# Patient Record
Sex: Female | Born: 1955
Health system: Southern US, Community
[De-identification: ages and names within clinical notes are randomized; demographics above are authoritative.]

## PROBLEM LIST (undated history)

## (undated) DIAGNOSIS — I10 Essential (primary) hypertension: Secondary | ICD-10-CM

## (undated) DIAGNOSIS — R945 Abnormal results of liver function studies: Secondary | ICD-10-CM

## (undated) DIAGNOSIS — K760 Fatty (change of) liver, not elsewhere classified: Secondary | ICD-10-CM

## (undated) DIAGNOSIS — E119 Type 2 diabetes mellitus without complications: Secondary | ICD-10-CM

## (undated) DIAGNOSIS — E7800 Pure hypercholesterolemia, unspecified: Secondary | ICD-10-CM

## (undated) DIAGNOSIS — G2581 Restless legs syndrome: Secondary | ICD-10-CM

## (undated) DIAGNOSIS — M1990 Unspecified osteoarthritis, unspecified site: Secondary | ICD-10-CM

## (undated) DIAGNOSIS — J189 Pneumonia, unspecified organism: Secondary | ICD-10-CM

## (undated) DIAGNOSIS — Z8489 Family history of other specified conditions: Secondary | ICD-10-CM

## (undated) HISTORY — PX: OTHER SURGICAL HISTORY: SHX169

## (undated) HISTORY — DX: Abnormal results of liver function studies: R94.5

## (undated) HISTORY — PX: SHOULDER SURGERY: SHX246

## (undated) HISTORY — DX: Unspecified osteoarthritis, unspecified site: M19.90

## (undated) HISTORY — DX: Essential (primary) hypertension: I10

## (undated) HISTORY — DX: Pure hypercholesterolemia, unspecified: E78.00

---

## 1998-02-10 ENCOUNTER — Inpatient Hospital Stay (HOSPITAL_COMMUNITY): Admission: AD | Admit: 1998-02-10 | Discharge: 1998-02-12 | Payer: Self-pay | Admitting: Obstetrics & Gynecology

## 1998-11-25 HISTORY — PX: CHOLECYSTECTOMY: SHX55

## 1998-11-29 ENCOUNTER — Emergency Department (HOSPITAL_COMMUNITY): Admission: EM | Admit: 1998-11-29 | Discharge: 1998-11-29 | Payer: Self-pay | Admitting: Emergency Medicine

## 1998-12-01 ENCOUNTER — Inpatient Hospital Stay (HOSPITAL_COMMUNITY): Admission: RE | Admit: 1998-12-01 | Discharge: 1998-12-03 | Payer: Self-pay | Admitting: *Deleted

## 1998-12-01 ENCOUNTER — Encounter: Payer: Self-pay | Admitting: *Deleted

## 1998-12-02 ENCOUNTER — Encounter: Payer: Self-pay | Admitting: *Deleted

## 2000-04-29 ENCOUNTER — Encounter: Admission: RE | Admit: 2000-04-29 | Discharge: 2000-04-29 | Payer: Self-pay | Admitting: Obstetrics & Gynecology

## 2000-04-29 ENCOUNTER — Encounter: Payer: Self-pay | Admitting: Obstetrics & Gynecology

## 2001-05-08 ENCOUNTER — Encounter: Admission: RE | Admit: 2001-05-08 | Discharge: 2001-05-08 | Payer: Self-pay | Admitting: Obstetrics & Gynecology

## 2001-05-08 ENCOUNTER — Encounter: Payer: Self-pay | Admitting: Obstetrics & Gynecology

## 2002-05-10 ENCOUNTER — Encounter: Payer: Self-pay | Admitting: Obstetrics & Gynecology

## 2002-05-10 ENCOUNTER — Encounter: Admission: RE | Admit: 2002-05-10 | Discharge: 2002-05-10 | Payer: Self-pay | Admitting: Obstetrics & Gynecology

## 2002-05-31 ENCOUNTER — Other Ambulatory Visit: Admission: RE | Admit: 2002-05-31 | Discharge: 2002-05-31 | Payer: Self-pay | Admitting: Obstetrics & Gynecology

## 2003-07-04 ENCOUNTER — Encounter: Payer: Self-pay | Admitting: Obstetrics & Gynecology

## 2003-07-04 ENCOUNTER — Encounter: Admission: RE | Admit: 2003-07-04 | Discharge: 2003-07-04 | Payer: Self-pay | Admitting: Obstetrics & Gynecology

## 2003-07-19 ENCOUNTER — Other Ambulatory Visit: Admission: RE | Admit: 2003-07-19 | Discharge: 2003-07-19 | Payer: Self-pay | Admitting: Obstetrics & Gynecology

## 2004-08-03 ENCOUNTER — Encounter: Admission: RE | Admit: 2004-08-03 | Discharge: 2004-08-03 | Payer: Self-pay | Admitting: Obstetrics & Gynecology

## 2004-08-13 ENCOUNTER — Other Ambulatory Visit: Admission: RE | Admit: 2004-08-13 | Discharge: 2004-08-13 | Payer: Self-pay | Admitting: Obstetrics & Gynecology

## 2004-10-29 ENCOUNTER — Ambulatory Visit: Payer: Self-pay | Admitting: Cardiology

## 2005-08-23 ENCOUNTER — Encounter: Admission: RE | Admit: 2005-08-23 | Discharge: 2005-08-23 | Payer: Self-pay | Admitting: Obstetrics & Gynecology

## 2005-09-20 ENCOUNTER — Other Ambulatory Visit: Admission: RE | Admit: 2005-09-20 | Discharge: 2005-09-20 | Payer: Self-pay | Admitting: Obstetrics & Gynecology

## 2006-03-18 ENCOUNTER — Encounter: Admission: RE | Admit: 2006-03-18 | Discharge: 2006-03-18 | Payer: Self-pay | Admitting: Family Medicine

## 2006-07-23 ENCOUNTER — Encounter: Admission: RE | Admit: 2006-07-23 | Discharge: 2006-07-23 | Payer: Self-pay | Admitting: Orthopedic Surgery

## 2006-08-11 ENCOUNTER — Encounter: Admission: RE | Admit: 2006-08-11 | Discharge: 2006-08-11 | Payer: Self-pay | Admitting: Orthopedic Surgery

## 2006-09-03 ENCOUNTER — Encounter: Admission: RE | Admit: 2006-09-03 | Discharge: 2006-09-03 | Payer: Self-pay | Admitting: Obstetrics & Gynecology

## 2007-09-06 ENCOUNTER — Encounter: Admission: RE | Admit: 2007-09-06 | Discharge: 2007-09-06 | Payer: Self-pay | Admitting: Orthopedic Surgery

## 2007-09-18 ENCOUNTER — Encounter: Admission: RE | Admit: 2007-09-18 | Discharge: 2007-09-18 | Payer: Self-pay | Admitting: Obstetrics & Gynecology

## 2008-09-21 ENCOUNTER — Encounter: Admission: RE | Admit: 2008-09-21 | Discharge: 2008-09-21 | Payer: Self-pay | Admitting: Obstetrics & Gynecology

## 2009-07-21 ENCOUNTER — Encounter: Admission: RE | Admit: 2009-07-21 | Discharge: 2009-07-21 | Payer: Self-pay | Admitting: Family Medicine

## 2009-09-22 ENCOUNTER — Encounter: Admission: RE | Admit: 2009-09-22 | Discharge: 2009-09-22 | Payer: Self-pay | Admitting: Obstetrics & Gynecology

## 2010-05-03 DIAGNOSIS — R030 Elevated blood-pressure reading, without diagnosis of hypertension: Secondary | ICD-10-CM | POA: Insufficient documentation

## 2010-05-03 DIAGNOSIS — I10 Essential (primary) hypertension: Secondary | ICD-10-CM | POA: Insufficient documentation

## 2010-05-03 DIAGNOSIS — Z8639 Personal history of other endocrine, nutritional and metabolic disease: Secondary | ICD-10-CM

## 2010-05-03 DIAGNOSIS — E7800 Pure hypercholesterolemia, unspecified: Secondary | ICD-10-CM | POA: Insufficient documentation

## 2010-05-03 DIAGNOSIS — Z862 Personal history of diseases of the blood and blood-forming organs and certain disorders involving the immune mechanism: Secondary | ICD-10-CM | POA: Insufficient documentation

## 2010-05-03 DIAGNOSIS — M159 Polyosteoarthritis, unspecified: Secondary | ICD-10-CM | POA: Insufficient documentation

## 2010-05-07 ENCOUNTER — Ambulatory Visit: Payer: Self-pay | Admitting: Pulmonary Disease

## 2010-06-07 ENCOUNTER — Ambulatory Visit (HOSPITAL_BASED_OUTPATIENT_CLINIC_OR_DEPARTMENT_OTHER): Admission: RE | Admit: 2010-06-07 | Discharge: 2010-06-07 | Payer: Self-pay | Admitting: Pulmonary Disease

## 2010-06-07 ENCOUNTER — Encounter: Payer: Self-pay | Admitting: Pulmonary Disease

## 2010-06-25 ENCOUNTER — Telehealth: Payer: Self-pay | Admitting: Pulmonary Disease

## 2010-06-26 ENCOUNTER — Ambulatory Visit: Payer: Self-pay | Admitting: Pulmonary Disease

## 2010-06-28 ENCOUNTER — Telehealth (INDEPENDENT_AMBULATORY_CARE_PROVIDER_SITE_OTHER): Payer: Self-pay | Admitting: *Deleted

## 2010-07-02 ENCOUNTER — Ambulatory Visit: Payer: Self-pay | Admitting: Pulmonary Disease

## 2010-07-02 DIAGNOSIS — G4761 Periodic limb movement disorder: Secondary | ICD-10-CM | POA: Insufficient documentation

## 2010-07-02 DIAGNOSIS — R0683 Snoring: Secondary | ICD-10-CM | POA: Insufficient documentation

## 2010-07-23 ENCOUNTER — Telehealth (INDEPENDENT_AMBULATORY_CARE_PROVIDER_SITE_OTHER): Payer: Self-pay | Admitting: *Deleted

## 2010-09-24 ENCOUNTER — Encounter: Admission: RE | Admit: 2010-09-24 | Discharge: 2010-09-24 | Payer: Self-pay | Admitting: Obstetrics & Gynecology

## 2010-12-16 ENCOUNTER — Encounter: Payer: Self-pay | Admitting: Obstetrics & Gynecology

## 2010-12-24 ENCOUNTER — Encounter
Admission: RE | Admit: 2010-12-24 | Discharge: 2010-12-24 | Payer: Self-pay | Source: Home / Self Care | Attending: Orthopedic Surgery | Admitting: Orthopedic Surgery

## 2010-12-25 NOTE — Assessment & Plan Note (Signed)
Summary: rov for review of sleep study   Copy to:  Maurice Small Primary Provider/Referring Provider:  Maurice Small  CC:  Pt is here for a f/u appt to discuss sleep study results. .  History of Present Illness: The pt comes in today for f/u of her recent sleep study.  She was found to have small numbers of obstructive events which do not meet the AHI criteria for osa diagnosis, but she did have moderate to loud snoring with frequent nonspecific arousals from sleep.  This is suggestive of UARS.  She was also found to have 120 plms, but very few resulted in arousal or awakening.  The pt's husband has complained about her kicking, but the pt is not aware that she does it.  She does occasionally have an unusual feeling in her legs, but nothing of signficance.  She does move her legs during the day at times to make them more comfortable.  Medications Prior to Update: 1)  Calcium Carbonate 1500 Mg Tabs (Calcium Carbonate) .Marland Kitchen.. 1 By Mouth Once Daily 2)  Vitamin D 1000 Unit Tabs (Cholecalciferol) .Marland Kitchen.. 1 By Mouth Once Daily 3)  Aspirin 81 Mg Tbec (Aspirin) .Marland Kitchen.. 1 By Mouth Once Daily 4)  Folic Acid 800 Mcg Tabs (Folic Acid) .... Take 1 Tablet By Mouth Once A Day 5)  Norvasc 5 Mg Tabs (Amlodipine Besylate) .Marland Kitchen.. 1 By Mouth Once Daily 6)  Multivitamins  Tabs (Multiple Vitamin) .... Take 1 Tablet By Mouth Once A Day  Allergies (verified): 1)  ! Demerol 2)  ! Ace Inhibitors  Review of Systems       The patient complains of non-productive cough, acid heartburn, indigestion, sore throat, and headaches.  The patient denies shortness of breath with activity, shortness of breath at rest, productive cough, coughing up blood, chest pain, irregular heartbeats, loss of appetite, weight change, abdominal pain, difficulty swallowing, tooth/dental problems, nasal congestion/difficulty breathing through nose, sneezing, itching, ear ache, anxiety, depression, hand/feet swelling, joint stiffness or pain, rash, change  in color of mucus, and fever.    Vital Signs:  Patient profile:   55 year old female Height:      62 inches Weight:      176.38 pounds BMI:     32.38 O2 Sat:      97 % on Room air Temp:     98.6 degrees F oral Pulse rate:   117 / minute BP sitting:   160 / 84  (right arm) Cuff size:   regular  Vitals Entered By: Arman Filter LPN (July 02, 2010 4:11 PM)  O2 Flow:  Room air CC: Pt is here for a f/u appt to discuss sleep study results.  Comments Medications reviewed with patient Arman Filter LPN  July 02, 2010 4:14 PM    Physical Exam  General:  ow female in nad Extremities:  no edema or cyanosis Neurologic:  alert and oriented, not sleepy, moves all 4.    Impression & Recommendations:  Problem # 1:  PERIODIC LIMB MOVEMENT DISORDER (ICD-333.99)  the pt had large numbers of plms during her sleep study.  It is unclear if this is contributing to her daytime symptoms, but I think we should give her a trial of a dopamine agonist to see if she improves.  She is willing to give this a try.    Problem # 2:  SNORING (ICD-786.09)  The pt did have loud snoring during the study, as well as many nonspecific arousals.  This  is suggestive of UARS, which is a pre-sleep apnea condition that can lead to sleep disruption.  I have recommended that she work on weight loss and positional therapy, and she can also consider upper airway surgery given her abnormal anatomy.  A dental appliance would work well for this, but I am hesitant to recommend due to her nasal obstruction.  Medications Added to Medication List This Visit: 1)  Requip 0.5 Mg Tabs (Ropinirole hcl) .... One each night after dinner  Other Orders: Est. Patient Level III (44010)  Patient Instructions: 1)  will try requip 0.5mg  one after dinner each night for next 3 weeks.  please call and give me feedback with your response. 2)  work on weight loss and staying off your back while sleeping 3)  if you want to consider ENT  evaluation, please let me know.   Prescriptions: REQUIP 0.5 MG  TABS (ROPINIROLE HCL) one each night after dinner  #30 x 6   Entered and Authorized by:   Barbaraann Share MD   Signed by:   Barbaraann Share MD on 07/02/2010   Method used:   Print then Give to Patient   RxID:   [id number]

## 2010-12-25 NOTE — Progress Notes (Signed)
Summary: sleep study results  Phone Note Call from Patient Call back at [phone]   Caller: Patient Call For: Tracie Wagner Summary of Call: calling for sleep study results Initial call taken by: Rickard Patience,  June 25, 2010 10:17 AM  Follow-up for Phone Call        per EMR, sleep study scheduled for 7.14.11 - called pt to verifiy.  sleep study not in EMR.  pt will be at work until 2 or 3, then may LM at home #.    [name], have you seen this? Boone Master CNA/MA  June 25, 2010 10:37 AM   No I have not seen this pt's sleep study results yet.  Will forward message to [facility] to see if sleep study has been read yet.  Aundra Millet Reynolds LPN  June 25, 2010 10:54 AM   Additional Follow-up for Phone Call Additional follow up Details #1::        will call when results available. Additional Follow-up by: Barbaraann Share MD,  June 25, 2010 5:09 PM

## 2010-12-25 NOTE — Assessment & Plan Note (Signed)
Summary: consult for possible osa   Copy to:  Maurice Small Primary Provider/Referring Provider:  Maurice Small  CC:  Sleep Consult.  History of Present Illness: The pt is a 55y/o female who I have been asked to see for possible osa.  She has been noted to have loud snoring, as well as pauses in her breathing during sleep.  She denies any choking arousals.  She goes to bed at 10pm and arises at 5-7am to start her day.  She is a restless sleeper, and feels tired in the am's upon arising.  She has a very active job, but does note sleep pressure while on her lunch breaks.  She does think her alertness and concentration while at work are not normal.  She does have issues at times with falling asleep while watching tv or movies, and notes some sleep pressure with driving longer distances.  Current Medications (verified): 1)  Calcium Carbonate 1500 Mg Tabs (Calcium Carbonate) .Marland Kitchen.. 1 By Mouth Once Daily 2)  Vitamin D 1000 Unit Tabs (Cholecalciferol) .Marland Kitchen.. 1 By Mouth Once Daily 3)  Aspirin 81 Mg Tbec (Aspirin) .Marland Kitchen.. 1 By Mouth Once Daily 4)  Folic Acid 800 Mcg Tabs (Folic Acid) .... Take 1 Tablet By Mouth Once A Day 5)  Norvasc 5 Mg Tabs (Amlodipine Besylate) .Marland Kitchen.. 1 By Mouth Once Daily 6)  Multivitamins  Tabs (Multiple Vitamin) .... Take 1 Tablet By Mouth Once A Day  Allergies (verified): 1)  ! Demerol 2)  ! Ace Inhibitors  Past History:  Past Medical History:  HYPERTENSION, BENIGN ESSENTIAL (ICD-401.1) OSTEOARTHRITIS, GENERALIZED, MULTIPLE JOINTS (ICD-715.09) PURE HYPERCHOLESTEROLEMIA (ICD-272.0) LIVER FUNCTION TESTS, ABNORMAL, HX OF (ICD-V12.2)    Past Surgical History: Shoulder surgery Cholecystectomy  Jan2000  Family History: Reviewed history and no changes required. heart disease: mother, maternal grandfather, sister  Social History: Reviewed history from 05/03/2010 and no changes required. Never smoked Rare ETOH Married pt has 2 children. pt works as an Hotel manager  Review of Systems       The patient complains of sore throat and headaches.  The patient denies shortness of breath with activity, shortness of breath at rest, productive cough, non-productive cough, coughing up blood, chest pain, irregular heartbeats, acid heartburn, indigestion, loss of appetite, weight change, abdominal pain, difficulty swallowing, tooth/dental problems, nasal congestion/difficulty breathing through nose, sneezing, itching, ear ache, anxiety, depression, hand/feet swelling, joint stiffness or pain, rash, change in color of mucus, and fever.    Vital Signs:  Patient profile:   55 year old female Height:      62 inches Weight:      175.31 pounds BMI:     32.18 O2 Sat:      95 % on Room air Temp:     98.3 degrees F oral Pulse rate:   78 / minute BP sitting:   138 / 86  (left arm) Cuff size:   regular  Vitals Entered By: Arman Filter LPN (May 07, 2010 1:39 PM)  O2 Flow:  Room air CC: Sleep Consult Comments Medications reviewed with patient Arman Filter LPN  May 07, 2010 1:45 PM    Physical Exam  General:  ow female in nad Eyes:  PERRLA and EOMI.   Nose:  deviated septum to left with significant narrowing Mouth:  moderate elongation of soft palate and uvula. Neck:  no jvd, tmg, LN Lungs:  clear to auscultation Heart:  rrr, no mrg Abdomen:  soft and nontender, bs+ Extremities:  no significant edema or cyanosis  pulses intact distally Neurologic:  alert and oriented, moves all 4.   Impression & Recommendations:  Problem # 1:  OBSTRUCTIVE SLEEP APNEA (ICD-327.23) the pt's history is very suggestive of osa.  Her husband has noted snoring and breathing issues during sleep, and she has very restless and nonrestorative sleep.  She stays very busy during the day, but clearly has EDS with periods of inactivity.  I have had a long discussion with the pt about sleep apnea, including its impact on QOL and CV health.  I think she needs a sleep study for  diagnosis, and will see her back when results are available.  The pt is agreeable.  Medications Added to Medication List This Visit: 1)  Folic Acid 800 Mcg Tabs (Folic acid) .... Take 1 tablet by mouth once a day 2)  Multivitamins Tabs (Multiple vitamin) .... Take 1 tablet by mouth once a day  Other Orders: Consultation Level IV (60454) Sleep Disorder Referral (Sleep Disorder)  Patient Instructions: 1)  will set up for sleep study, and arrange followup once results are available.

## 2010-12-25 NOTE — Progress Notes (Signed)
Summary: FYI - LMTCB x 1  Phone Note Call from Patient Call back at [facility] Phone [phone]   Caller: Patient Call For: clance Summary of Call: FYI: Wants [facility] to know that requip seems to be working. Initial call taken by: Darletta Moll,  July 23, 2010 3:29 PM  Follow-up for Phone Call        At last OV on 8.8.11, pt was told to try requip 0.5mg  one after dinner each night for next 3 weeks and call for update on how this is working.  Spoke with pt.  She states she is not as tired during the day and does not feel the need to take a nap at lunch.  States this seems to be working.  Will forward message to [facility] as FYI. Follow-up by: Gweneth Dimitri RN,  July 23, 2010 3:44 PM  Additional Follow-up for Phone Call Additional follow up Details #1::        good.  I would stay on this. keep working on weight loss and positional therapy to help your snoring. Additional Follow-up by: Barbaraann Share MD,  July 23, 2010 5:28 PM    Additional Follow-up for Phone Call Additional follow up Details #2::    Left message with pt's family memeber for her to call office back.  Gweneth Dimitri RN  July 23, 2010 5:30 PM  pt returned our call.  informed her of [facility]'s response/recs to continue on requip, work on weight loss and positional therapy to help with snoring.  pt verbalized understanding and denied any questions.  Aundra Millet Reynolds LPN  July 24, 2010 9:13 AM

## 2010-12-25 NOTE — Progress Notes (Signed)
Summary: need to sched ov with kc-LMTCBx  1  Phone Note Outgoing Call   Call placed by: Arman Filter LPN,  June 28, 2010 2:28 PM Call placed to: Patient Summary of Call: per [facility], pt needs ov with kc next week  to discuss sleep study results. Marland KitchenArman Filter LPN  June 28, 2010 2:28 PM   Follow-up for Phone Call        [facility] Vernie Murders  June 28, 2010 3:48 PM   pt returned our call.  pt scheduled to see [facility] 07-02-2010 at 4:15pm.  Arman Filter LPN  June 28, 2010 5:07 PM

## 2011-04-26 ENCOUNTER — Emergency Department (HOSPITAL_COMMUNITY)
Admission: EM | Admit: 2011-04-26 | Discharge: 2011-04-26 | Disposition: A | Discharge status: Home / Self Care | Payer: No Typology Code available for payment source | Attending: Emergency Medicine | Admitting: Emergency Medicine

## 2011-04-26 ENCOUNTER — Emergency Department (HOSPITAL_COMMUNITY): Payer: No Typology Code available for payment source

## 2011-04-26 DIAGNOSIS — Y92410 Unspecified street and highway as the place of occurrence of the external cause: Secondary | ICD-10-CM | POA: Insufficient documentation

## 2011-04-26 DIAGNOSIS — M47812 Spondylosis without myelopathy or radiculopathy, cervical region: Secondary | ICD-10-CM | POA: Insufficient documentation

## 2011-04-26 DIAGNOSIS — R079 Chest pain, unspecified: Secondary | ICD-10-CM | POA: Insufficient documentation

## 2011-04-26 DIAGNOSIS — M542 Cervicalgia: Secondary | ICD-10-CM | POA: Insufficient documentation

## 2011-04-26 DIAGNOSIS — M25519 Pain in unspecified shoulder: Secondary | ICD-10-CM | POA: Insufficient documentation

## 2011-04-26 DIAGNOSIS — I10 Essential (primary) hypertension: Secondary | ICD-10-CM | POA: Insufficient documentation

## 2011-04-26 DIAGNOSIS — T1490XA Injury, unspecified, initial encounter: Secondary | ICD-10-CM | POA: Insufficient documentation

## 2011-06-05 ENCOUNTER — Other Ambulatory Visit: Payer: Self-pay | Admitting: Pulmonary Disease

## 2011-09-02 ENCOUNTER — Other Ambulatory Visit: Payer: Self-pay | Admitting: Obstetrics & Gynecology

## 2011-09-02 DIAGNOSIS — Z1231 Encounter for screening mammogram for malignant neoplasm of breast: Secondary | ICD-10-CM

## 2011-09-30 ENCOUNTER — Ambulatory Visit
Admission: RE | Admit: 2011-09-30 | Discharge: 2011-09-30 | Disposition: A | Discharge status: Home / Self Care | Payer: PRIVATE HEALTH INSURANCE | Source: Ambulatory Visit | Attending: Obstetrics & Gynecology | Admitting: Obstetrics & Gynecology

## 2011-09-30 DIAGNOSIS — Z1231 Encounter for screening mammogram for malignant neoplasm of breast: Secondary | ICD-10-CM

## 2011-10-14 ENCOUNTER — Encounter: Payer: Self-pay | Admitting: Pulmonary Disease

## 2011-10-15 ENCOUNTER — Encounter: Payer: Self-pay | Admitting: Pulmonary Disease

## 2011-10-15 ENCOUNTER — Ambulatory Visit (INDEPENDENT_AMBULATORY_CARE_PROVIDER_SITE_OTHER): Payer: PRIVATE HEALTH INSURANCE | Admitting: Pulmonary Disease

## 2011-10-15 VITALS — BP 120/76 | HR 83 | Temp 98.1°F | Ht 62.0 in | Wt 166.8 lb

## 2011-10-15 DIAGNOSIS — G2589 Other specified extrapyramidal and movement disorders: Secondary | ICD-10-CM

## 2011-10-15 MED ORDER — ROPINIROLE HCL 0.5 MG PO TABS: 1.0000 mg | ORAL_TABLET | Freq: Every day | ORAL | Status: DC

## 2011-10-15 NOTE — Assessment & Plan Note (Signed)
The patient has seen definite improvement in her leg kicking and also sleep since being on the Requip.  She is still having some breakthrough noted, and I have told her to increase her dose after dinner to see if that will help.  If she continues to have issues would consider checking an iron panel.  If doing well, she will follow up with me in one year.

## 2011-10-15 NOTE — Progress Notes (Signed)
  Subjective:    Patient ID: Tracie Wagner, female    DOB: 1956/11/16, 55 y.o.   MRN: [id number]  HPI The patient comes in today for followup of her known periodic limb movement disorder.  She was started on Requip at the last visit, and has seen some improvement in her leg kicks and also her sleep.  She still has some breakthrough according to her husband.  She knows that she is continuing to snore, but she has lost 10 pounds since the last visit.  I have encouraged her to continue.  Also discussed with her again treatment for her snoring other than weight loss, which can include upper airway surgery and dental appliance.   Review of Systems  Constitutional: Negative for fever and unexpected weight change.  HENT: Positive for congestion. Negative for ear pain, nosebleeds, sore throat, rhinorrhea, sneezing, trouble swallowing, dental problem, postnasal drip and sinus pressure.   Eyes: Negative for redness and itching.  Respiratory: Negative for cough, chest tightness, shortness of breath and wheezing.   Cardiovascular: Negative for palpitations and leg swelling.  Gastrointestinal: Negative for nausea and vomiting.  Genitourinary: Negative for dysuria.  Musculoskeletal: Negative for joint swelling.  Skin: Negative for rash.  Neurological: Negative for headaches.  Hematological: Does not bruise/bleed easily.  Psychiatric/Behavioral: Negative for dysphoric mood. The patient is not nervous/anxious.        Objective:   Physical Exam Overweight female in no acute distress Nose without purulence or discharge noted Lower extremities without edema , no cyanosis Alert, does not appear to be sleepy, moves all 4 extremities.       Assessment & Plan:

## 2011-10-15 NOTE — Patient Instructions (Signed)
Try increasing requip to 2 of the 0.5mg  tabs after dinner.   Continue working on weight loss, you are doing great.  followup with me in one year, but call if still having breakthru kicks.
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
## 2012-10-13 ENCOUNTER — Other Ambulatory Visit: Payer: Self-pay | Admitting: Obstetrics & Gynecology

## 2012-10-13 DIAGNOSIS — Z1231 Encounter for screening mammogram for malignant neoplasm of breast: Secondary | ICD-10-CM

## 2012-10-27 ENCOUNTER — Telehealth: Payer: Self-pay | Admitting: Pulmonary Disease

## 2012-10-27 NOTE — Telephone Encounter (Deleted)
Last OV 10/15/11

## 2012-10-27 NOTE — Telephone Encounter (Addendum)
Last OV 10/15/11 told to f/u 1 year. Needs to scheduled ROV before refills can be sent. lmomtcb x1 for pt

## 2012-10-28 NOTE — Telephone Encounter (Signed)
LMTCB

## 2012-10-29 MED ORDER — ROPINIROLE HCL 0.5 MG PO TABS: 1.0000 mg | ORAL_TABLET | Freq: Every day | ORAL | Status: DC

## 2012-10-29 NOTE — Telephone Encounter (Signed)
LMOMTCB x 1 for the pt. 

## 2012-10-29 NOTE — Telephone Encounter (Signed)
Spoke to pt and she understands that she needs an appointment. She will be seen on 11/11/12 at 11:45am. I advised that we can give her enough medication until her appointment.

## 2012-10-29 NOTE — Telephone Encounter (Signed)
Returning call can be reached at [phone].Tracie Wagner

## 2012-11-11 ENCOUNTER — Encounter: Payer: Self-pay | Admitting: Pulmonary Disease

## 2012-11-11 ENCOUNTER — Ambulatory Visit (INDEPENDENT_AMBULATORY_CARE_PROVIDER_SITE_OTHER): Payer: PRIVATE HEALTH INSURANCE | Admitting: Pulmonary Disease

## 2012-11-11 VITALS — BP 128/80 | HR 83 | Temp 98.2°F | Ht 62.0 in | Wt 176.8 lb

## 2012-11-11 DIAGNOSIS — R0989 Other specified symptoms and signs involving the circulatory and respiratory systems: Secondary | ICD-10-CM

## 2012-11-11 DIAGNOSIS — G2589 Other specified extrapyramidal and movement disorders: Secondary | ICD-10-CM

## 2012-11-11 DIAGNOSIS — R0609 Other forms of dyspnea: Secondary | ICD-10-CM

## 2012-11-11 MED ORDER — ROPINIROLE HCL 0.5 MG PO TABS: 1.0000 mg | ORAL_TABLET | Freq: Every day | ORAL | Status: DC

## 2012-11-11 NOTE — Progress Notes (Signed)
  Subjective:    Patient ID: Tracie Wagner, female    DOB: 19-Oct-1956, 56 y.o.   MRN: [id number]  HPI Patient comes in today for followup of her restless leg syndrome.  She is maintained on her dopamine agonist, and feels that she is doing very well with this.  She rarely has breakthrough, and feels that she is sleeping better.  Her weight has gone up since the last visit, and she has a little more snoring.   Review of Systems  Constitutional: Negative for fever and unexpected weight change.  HENT: Negative for ear pain, nosebleeds, congestion, sore throat, rhinorrhea, sneezing, trouble swallowing, dental problem, postnasal drip and sinus pressure.   Eyes: Negative for redness and itching.  Respiratory: Negative for cough, chest tightness, shortness of breath and wheezing.   Cardiovascular: Negative for palpitations and leg swelling.  Gastrointestinal: Negative for nausea and vomiting.  Genitourinary: Negative for dysuria.  Musculoskeletal: Negative for joint swelling.  Skin: Negative for rash.  Neurological: Negative for headaches.  Hematological: Does not bruise/bleed easily.  Psychiatric/Behavioral: Negative for dysphoric mood. The patient is not nervous/anxious.        Objective:   Physical Exam Overweight female in no acute distress Nose without purulent discharge noted  Neck without lymphadenopathy or thyromegaly Lower extremities with minimal edema, no cyanosis Alert and oriented, does not appear to be sleepy, moves all 4 extremities.       Assessment & Plan:

## 2012-11-11 NOTE — Patient Instructions (Addendum)
Will refill your requip. Work on weight loss and exercise program followup with me in one year if doing well.

## 2012-11-11 NOTE — Assessment & Plan Note (Signed)
She has gained 10 pounds since the last visit, and I have encouraged her to keep working on this.

## 2012-11-11 NOTE — Assessment & Plan Note (Signed)
The pt is sleeping well on her dopamine agonist, and feels that her limb movements are fairly well controlled.  I have asked her to stay on her current meds, and to work on exercise which is also a treatment for RLS.  I will see her back in a year, but if still doing well, will turn her back over to her primary who can write for her requip.

## 2012-11-23 ENCOUNTER — Ambulatory Visit
Admission: RE | Admit: 2012-11-23 | Discharge: 2012-11-23 | Disposition: A | Discharge status: Home / Self Care | Payer: PRIVATE HEALTH INSURANCE | Source: Ambulatory Visit | Attending: Obstetrics & Gynecology | Admitting: Obstetrics & Gynecology

## 2012-11-23 DIAGNOSIS — Z1231 Encounter for screening mammogram for malignant neoplasm of breast: Secondary | ICD-10-CM

## 2012-11-30 ENCOUNTER — Other Ambulatory Visit: Payer: Self-pay | Admitting: Obstetrics & Gynecology

## 2012-11-30 DIAGNOSIS — R928 Other abnormal and inconclusive findings on diagnostic imaging of breast: Secondary | ICD-10-CM

## 2012-12-07 ENCOUNTER — Ambulatory Visit
Admission: RE | Admit: 2012-12-07 | Discharge: 2012-12-07 | Disposition: A | Discharge status: Home / Self Care | Payer: PRIVATE HEALTH INSURANCE | Source: Ambulatory Visit | Attending: Obstetrics & Gynecology | Admitting: Obstetrics & Gynecology

## 2012-12-07 DIAGNOSIS — R928 Other abnormal and inconclusive findings on diagnostic imaging of breast: Secondary | ICD-10-CM

## 2013-01-15 ENCOUNTER — Telehealth: Payer: Self-pay | Admitting: Pulmonary Disease

## 2013-01-15 MED ORDER — ROPINIROLE HCL 0.5 MG PO TABS: 1.0000 mg | ORAL_TABLET | Freq: Every day | ORAL | Status: DC

## 2013-01-15 NOTE — Telephone Encounter (Signed)
Rx was sent to pharm  LMOM for the pt to be made aware 

## 2013-04-12 ENCOUNTER — Encounter (HOSPITAL_COMMUNITY): Payer: Self-pay | Admitting: Emergency Medicine

## 2013-04-12 ENCOUNTER — Emergency Department (HOSPITAL_COMMUNITY)
Admission: EM | Admit: 2013-04-12 | Discharge: 2013-04-12 | Disposition: A | Discharge status: Home / Self Care | Payer: PRIVATE HEALTH INSURANCE | Attending: Emergency Medicine | Admitting: Emergency Medicine

## 2013-04-12 DIAGNOSIS — Z888 Allergy status to other drugs, medicaments and biological substances status: Secondary | ICD-10-CM | POA: Insufficient documentation

## 2013-04-12 DIAGNOSIS — I10 Essential (primary) hypertension: Secondary | ICD-10-CM | POA: Insufficient documentation

## 2013-04-12 DIAGNOSIS — Y9389 Activity, other specified: Secondary | ICD-10-CM | POA: Insufficient documentation

## 2013-04-12 DIAGNOSIS — Z8719 Personal history of other diseases of the digestive system: Secondary | ICD-10-CM | POA: Insufficient documentation

## 2013-04-12 DIAGNOSIS — S61209A Unspecified open wound of unspecified finger without damage to nail, initial encounter: Secondary | ICD-10-CM | POA: Insufficient documentation

## 2013-04-12 DIAGNOSIS — E7800 Pure hypercholesterolemia, unspecified: Secondary | ICD-10-CM | POA: Insufficient documentation

## 2013-04-12 DIAGNOSIS — W260XXA Contact with knife, initial encounter: Secondary | ICD-10-CM | POA: Insufficient documentation

## 2013-04-12 DIAGNOSIS — Z23 Encounter for immunization: Secondary | ICD-10-CM | POA: Insufficient documentation

## 2013-04-12 DIAGNOSIS — Z79899 Other long term (current) drug therapy: Secondary | ICD-10-CM | POA: Insufficient documentation

## 2013-04-12 DIAGNOSIS — Z8739 Personal history of other diseases of the musculoskeletal system and connective tissue: Secondary | ICD-10-CM | POA: Insufficient documentation

## 2013-04-12 DIAGNOSIS — Y92009 Unspecified place in unspecified non-institutional (private) residence as the place of occurrence of the external cause: Secondary | ICD-10-CM | POA: Insufficient documentation

## 2013-04-12 DIAGNOSIS — Z7982 Long term (current) use of aspirin: Secondary | ICD-10-CM | POA: Insufficient documentation

## 2013-04-12 DIAGNOSIS — S61219A Laceration without foreign body of unspecified finger without damage to nail, initial encounter: Secondary | ICD-10-CM

## 2013-04-12 MED ORDER — TETANUS-DIPHTH-ACELL PERTUSSIS 5-2.5-18.5 LF-MCG/0.5 IM SUSP
0.5000 mL | Freq: Once | INTRAMUSCULAR | Status: AC
  Administered 2013-04-12: 0.5 mL via INTRAMUSCULAR
  Filled 2013-04-12: qty 0.5

## 2013-04-12 NOTE — ED Provider Notes (Signed)
History    This chart was scribed for [name], MD by Leone Payor, ED Scribe. This patient was seen in room TR05C/TR05C and the patient's care was started 9:24 PM.   CSN: [id number]  Arrival date & time 04/12/13  2054   First MD Initiated Contact with Patient 04/12/13 2120      Chief Complaint  Patient presents with  . Finger Injury    Patient is a 57 y.o. female presenting with skin laceration. The history is provided by the patient. No language interpreter was used.  Laceration Location:  Finger Finger laceration location:  R index finger Length (cm):  1 Bleeding: controlled   Time since incident:  1 hour Laceration mechanism:  Knife Foreign body present:  No foreign bodies   HPI Comments: Tracie Wagner is a 57 y.o. female who presents to the Emergency Department complaining of a finger laceration to the mid R index finger that occurred about 1 hour ago. States she sustained the cut while cleaning a kitchen knife. It is not actively bleeding currently. Last tetanus was in  2005. Pt denies smoking and alcohol use.   Past Medical History  Diagnosis Date  . Hypertension   . Osteoarthritis   . Hypercholesteremia   . Abnormal liver function     Past Surgical History  Procedure Laterality Date  . Shoulder surgery    . Cholecystectomy  11/1998    Family History  Problem Relation Age of Onset  . Heart disease Mother   . Heart disease Sister   . Heart disease Maternal Grandfather     History  Substance Use Topics  . Smoking status: Never Smoker   . Smokeless tobacco: Not on file  . Alcohol Use: Not on file    OB History   Grav Para Term Preterm Abortions TAB SAB Ect Mult Living                  Review of Systems  Skin: Positive for wound (laceration).    Allergies  Ace inhibitors; Crestor; Demerol; and Meperidine hcl  Home Medications   Current Outpatient Rx  Name  Route  Sig  Dispense  Refill  . amLODipine (NORVASC) 5 MG tablet   Oral  Take 5 mg by mouth daily.           Marland Kitchen aspirin 81 MG tablet   Oral   Take 81 mg by mouth daily.           . Calcium 1500 MG tablet   Oral   Take 1,500 mg by mouth daily.           . cholecalciferol (VITAMIN D) 1000 UNITS tablet   Oral   Take 1,000 Units by mouth daily.           . fish oil-omega-3 fatty acids 1000 MG capsule   Oral   Take 1 capsule by mouth 2 (two) times daily.           . folic acid (FOLVITE) 800 MCG tablet   Oral   Take 800 mcg by mouth daily.           . metFORMIN (GLUCOPHAGE) 500 MG tablet   Oral   Take 500 mg by mouth 2 (two) times daily.           . Multiple Vitamin (MULTIVITAMIN) capsule   Oral   Take 1 capsule by mouth daily.           Marland Kitchen rOPINIRole (REQUIP) 0.5  MG tablet   Oral   Take 2 tablets (1 mg total) by mouth at bedtime.   60 tablet   10   . WELCHOL 625 MG tablet   Oral   Take 2 tablets by mouth Twice daily.           BP 142/77  Pulse 87  Temp(Src) 98.4 F (36.9 C) (Oral)  Resp 14  SpO2 96%  Physical Exam  Nursing note and vitals reviewed. Constitutional: She is oriented to person, place, and time. She appears well-developed and well-nourished. No distress.  HENT:  Head: Normocephalic and atraumatic.  Eyes: EOM are normal.  Neck: Neck supple. No tracheal deviation present.  Cardiovascular: Normal rate.   Pulmonary/Chest: Effort normal. No respiratory distress.  Musculoskeletal: Normal range of motion.  1 cm laceration to the PIP joint of the R index finger. No obvious tendon injury.  No contamination. No active bleeding. Good cap refill distally.  Neurological: She is alert and oriented to person, place, and time.  Skin: Skin is warm and dry.  Psychiatric: She has a normal mood and affect. Her behavior is normal.    ED Course  Procedures (including critical care time)  DIAGNOSTIC STUDIES: Oxygen Saturation is 96% on room air, adequate by my interpretation.    COORDINATION OF CARE: 9:33 PM  Discussed treatment plan with pt at bedside and pt agreed to plan.   LACERATION REPAIR Performed by: [name] Consent: Verbal consent obtained. Risks and benefits: risks, benefits and alternatives were discussed Patient identity confirmed: provided demographic data Time out performed prior to procedure Prepped and Draped in normal sterile fashion Wound explored, no tendon or vascular injury.  Laceration Location: Right index finger Laceration Length: 1 cm No Foreign Bodies seen or palpated Anesthesia: local infiltration Local anesthetic: lidocaine 1% w/o epinephrine Anesthetic total: 4 ml Irrigation method: syringe Amount of cleaning: standard, irrigated thoroughly  Skin closure: yes Number of sutures or staples: 3 nylon sutures, close approximation. Technique: simple interrupted.  Patient tolerance: Patient tolerated the procedure well with no immediate complications.   Labs Reviewed - No data to display No results found.   1. Finger laceration, initial encounter       MDM  I personally performed the services described in this documentation, which was scribed in my presence. The recorded information has been reviewed and is accurate. Pt instructed to have sutures removed in 1 week by PMD or ED. Return precautions given.   [name], MD 04/13/13 [phone]

## 2013-04-12 NOTE — ED Notes (Signed)
PT. PRESENTS WITH LACERATION AT MID/RIGHT INDEX FINGER SUSTAINED THIS EVENING WHILE CLEANING KITCHEN KNIFE. BLEEDING CONTROLLED / DRESSING APPLIED AT TRIAGE .

## 2013-05-04 ENCOUNTER — Other Ambulatory Visit: Payer: Self-pay | Admitting: Obstetrics & Gynecology

## 2013-05-04 DIAGNOSIS — N6001 Solitary cyst of right breast: Secondary | ICD-10-CM

## 2013-06-07 ENCOUNTER — Ambulatory Visit
Admission: RE | Admit: 2013-06-07 | Discharge: 2013-06-07 | Disposition: A | Discharge status: Home / Self Care | Payer: PRIVATE HEALTH INSURANCE | Source: Ambulatory Visit | Attending: Obstetrics & Gynecology | Admitting: Obstetrics & Gynecology

## 2013-06-07 ENCOUNTER — Other Ambulatory Visit: Payer: Self-pay | Admitting: Obstetrics & Gynecology

## 2013-06-07 DIAGNOSIS — N6001 Solitary cyst of right breast: Secondary | ICD-10-CM

## 2013-08-01 ENCOUNTER — Other Ambulatory Visit: Payer: Self-pay | Admitting: Pulmonary Disease

## 2013-08-04 ENCOUNTER — Telehealth: Payer: Self-pay | Admitting: Pulmonary Disease

## 2013-08-04 MED ORDER — ROPINIROLE HCL 0.5 MG PO TABS: 1.0000 mg | ORAL_TABLET | Freq: Every day | ORAL | Status: DC

## 2013-08-04 NOTE — Telephone Encounter (Signed)
Rx was sent to the pharm

## 2013-10-18 ENCOUNTER — Other Ambulatory Visit: Payer: Self-pay | Admitting: Pulmonary Disease

## 2013-11-25 DIAGNOSIS — K760 Fatty (change of) liver, not elsewhere classified: Secondary | ICD-10-CM

## 2013-11-25 HISTORY — PX: OTHER SURGICAL HISTORY: SHX169

## 2013-11-25 HISTORY — DX: Fatty (change of) liver, not elsewhere classified: K76.0

## 2013-11-30 ENCOUNTER — Other Ambulatory Visit: Payer: Self-pay | Admitting: Obstetrics & Gynecology

## 2013-11-30 DIAGNOSIS — N6489 Other specified disorders of breast: Secondary | ICD-10-CM

## 2013-12-02 ENCOUNTER — Encounter: Payer: Self-pay | Admitting: Pulmonary Disease

## 2013-12-02 ENCOUNTER — Ambulatory Visit (INDEPENDENT_AMBULATORY_CARE_PROVIDER_SITE_OTHER): Payer: PRIVATE HEALTH INSURANCE | Admitting: Pulmonary Disease

## 2013-12-02 ENCOUNTER — Encounter (INDEPENDENT_AMBULATORY_CARE_PROVIDER_SITE_OTHER): Payer: Self-pay

## 2013-12-02 VITALS — BP 100/74 | HR 80 | Temp 97.4°F | Ht 61.0 in | Wt 163.2 lb

## 2013-12-02 DIAGNOSIS — G2589 Other specified extrapyramidal and movement disorders: Secondary | ICD-10-CM

## 2013-12-02 DIAGNOSIS — R0609 Other forms of dyspnea: Secondary | ICD-10-CM

## 2013-12-02 DIAGNOSIS — R0989 Other specified symptoms and signs involving the circulatory and respiratory systems: Secondary | ICD-10-CM

## 2013-12-02 MED ORDER — ROPINIROLE HCL 0.5 MG PO TABS: ORAL_TABLET | ORAL | Status: DC

## 2013-12-02 NOTE — Assessment & Plan Note (Signed)
The patient has lost significant weight since last visit, and I've asked her to continue doing this.

## 2013-12-02 NOTE — Progress Notes (Signed)
   Subjective:    Patient ID: Nadeen LandauMartha M Zorn, female    DOB: [birth year], 58 y.o.   MRN: [id number]  HPI The patient comes in today for followup of her restless leg syndrome. She also has a history of snoring but did not have sleep apnea on her sleep study. She has lost 13 pounds since last visit, and I have commended her on this. She has been doing well on her dopamine agonist, and feels that her sleep and daytime alertness are good.   Review of Systems  Constitutional: Negative for fever and unexpected weight change.  HENT: Negative for congestion, dental problem, ear pain, nosebleeds, postnasal drip, rhinorrhea, sinus pressure, sneezing, sore throat and trouble swallowing.   Eyes: Negative for redness and itching.  Respiratory: Negative for cough, chest tightness, shortness of breath and wheezing.   Cardiovascular: Negative for palpitations and leg swelling.  Gastrointestinal: Negative for nausea and vomiting.  Genitourinary: Negative for dysuria.  Musculoskeletal: Negative for joint swelling.  Skin: Negative for rash.  Neurological: Negative for headaches.  Hematological: Does not bruise/bleed easily.  Psychiatric/Behavioral: Negative for dysphoric mood. The patient is not nervous/anxious.        Objective:   Physical Exam Overweight female in no acute distress Nose without purulence or discharge noted Neck without lymphadenopathy or thyromegaly Lower extremities without edema, no cyanosis Alert and oriented, moves all 4 extremities.       Assessment & Plan:

## 2013-12-02 NOTE — Addendum Note (Signed)
Addended by: Maisie FusGREEN, Braydyn Schultes M on: 12/02/2013 10:41 AM   Modules accepted: Orders

## 2013-12-02 NOTE — Patient Instructions (Signed)
Continue on your medication for the restless legs Keep working on weight loss.  You are doing great. followup with me in one year, but call if having issues.

## 2013-12-02 NOTE — Assessment & Plan Note (Signed)
The patient feels that she is sleeping well with excellent daytime alertness on her dopamine agonist. I've asked her to continue on this medication.

## 2013-12-06 ENCOUNTER — Ambulatory Visit
Admission: RE | Admit: 2013-12-06 | Discharge: 2013-12-06 | Disposition: A | Discharge status: Home / Self Care | Payer: PRIVATE HEALTH INSURANCE | Source: Ambulatory Visit | Attending: Obstetrics & Gynecology | Admitting: Obstetrics & Gynecology

## 2013-12-06 DIAGNOSIS — N6489 Other specified disorders of breast: Secondary | ICD-10-CM

## 2013-12-15 ENCOUNTER — Other Ambulatory Visit: Payer: Self-pay | Admitting: Pulmonary Disease

## 2014-02-22 ENCOUNTER — Other Ambulatory Visit: Payer: Self-pay | Admitting: Pulmonary Disease

## 2014-06-19 ENCOUNTER — Other Ambulatory Visit: Payer: Self-pay | Admitting: Pulmonary Disease

## 2014-08-09 ENCOUNTER — Other Ambulatory Visit: Payer: Self-pay | Admitting: Pulmonary Disease

## 2014-09-28 ENCOUNTER — Other Ambulatory Visit: Payer: Self-pay | Admitting: Family Medicine

## 2014-09-28 DIAGNOSIS — R748 Abnormal levels of other serum enzymes: Secondary | ICD-10-CM

## 2014-10-06 ENCOUNTER — Ambulatory Visit
Admission: RE | Admit: 2014-10-06 | Discharge: 2014-10-06 | Disposition: A | Discharge status: Home / Self Care | Payer: PRIVATE HEALTH INSURANCE | Source: Ambulatory Visit | Attending: Family Medicine | Admitting: Family Medicine

## 2014-10-06 DIAGNOSIS — R748 Abnormal levels of other serum enzymes: Secondary | ICD-10-CM

## 2014-12-02 ENCOUNTER — Other Ambulatory Visit: Payer: Self-pay

## 2014-12-02 DIAGNOSIS — Z139 Encounter for screening, unspecified: Secondary | ICD-10-CM

## 2014-12-09 ENCOUNTER — Ambulatory Visit
Admission: RE | Admit: 2014-12-09 | Discharge: 2014-12-09 | Disposition: A | Discharge status: Home / Self Care | Payer: PRIVATE HEALTH INSURANCE | Source: Ambulatory Visit

## 2014-12-09 ENCOUNTER — Encounter (INDEPENDENT_AMBULATORY_CARE_PROVIDER_SITE_OTHER): Payer: Self-pay

## 2014-12-09 DIAGNOSIS — Z139 Encounter for screening, unspecified: Secondary | ICD-10-CM

## 2014-12-23 ENCOUNTER — Ambulatory Visit: Payer: PRIVATE HEALTH INSURANCE | Admitting: Pulmonary Disease

## 2014-12-31 ENCOUNTER — Other Ambulatory Visit: Payer: Self-pay | Admitting: Pulmonary Disease

## 2015-01-06 ENCOUNTER — Ambulatory Visit (INDEPENDENT_AMBULATORY_CARE_PROVIDER_SITE_OTHER): Payer: PRIVATE HEALTH INSURANCE | Admitting: Pulmonary Disease

## 2015-01-06 ENCOUNTER — Encounter: Payer: Self-pay | Admitting: Pulmonary Disease

## 2015-01-06 VITALS — BP 122/74 | HR 65 | Temp 97.6°F | Ht 61.0 in | Wt 173.6 lb

## 2015-01-06 DIAGNOSIS — G4761 Periodic limb movement disorder: Secondary | ICD-10-CM

## 2015-01-06 DIAGNOSIS — R0683 Snoring: Secondary | ICD-10-CM

## 2015-01-06 NOTE — Progress Notes (Signed)
   Subjective:    Patient ID: Tracie Wagner, female    DOB: [birth year], 59 y.o.   MRN: [id number]  HPI The patient comes in today for follow-up of her known periodic limb movement disorder. She is taking Requip compliantly, and feels this is controlling her limb movements very well. Having no side effects from the medication.   Review of Systems  Constitutional: Negative for fever and unexpected weight change.  HENT: Negative for congestion, dental problem, ear pain, nosebleeds, postnasal drip, rhinorrhea, sinus pressure, sneezing, sore throat and trouble swallowing.   Eyes: Negative for redness and itching.  Respiratory: Negative for cough, chest tightness, shortness of breath and wheezing.   Cardiovascular: Negative for palpitations and leg swelling.  Gastrointestinal: Negative for nausea and vomiting.  Genitourinary: Negative for dysuria.  Musculoskeletal: Negative for joint swelling.  Skin: Negative for rash.  Neurological: Negative for headaches.  Hematological: Does not bruise/bleed easily.  Psychiatric/Behavioral: Negative for dysphoric mood. The patient is not nervous/anxious.        Objective:   Physical Exam Overweight female in no acute distress Nose without purulence or discharge noted Lower extremity without significant edema, no cyanosis Alert and oriented, moves all 4 extremities.       Assessment & Plan:

## 2015-01-06 NOTE — Patient Instructions (Addendum)
Continue on your requip Keep working on weight loss followup with me again in one year.  If you continue to do well, will see you back as needed.

## 2015-01-06 NOTE — Assessment & Plan Note (Signed)
The patient continues to do well on Requip, and feels that it has helped her limb movements significantly. He is having no side effects from the medication.

## 2015-04-20 ENCOUNTER — Other Ambulatory Visit: Payer: Self-pay | Admitting: Pulmonary Disease

## 2015-11-02 ENCOUNTER — Encounter: Payer: Self-pay | Admitting: Adult Health

## 2015-11-03 ENCOUNTER — Encounter: Payer: Self-pay | Admitting: Adult Health

## 2015-11-06 MED ORDER — ROPINIROLE HCL 0.5 MG PO TABS: ORAL_TABLET | ORAL | Status: DC

## 2015-11-14 ENCOUNTER — Other Ambulatory Visit: Payer: Self-pay

## 2015-11-14 DIAGNOSIS — Z1231 Encounter for screening mammogram for malignant neoplasm of breast: Secondary | ICD-10-CM

## 2015-12-13 ENCOUNTER — Ambulatory Visit
Admission: RE | Admit: 2015-12-13 | Discharge: 2015-12-13 | Disposition: A | Discharge status: Home / Self Care | Payer: PRIVATE HEALTH INSURANCE | Source: Ambulatory Visit

## 2015-12-13 DIAGNOSIS — Z1231 Encounter for screening mammogram for malignant neoplasm of breast: Secondary | ICD-10-CM

## 2016-01-08 ENCOUNTER — Encounter: Payer: Self-pay | Admitting: Adult Health

## 2016-01-08 ENCOUNTER — Ambulatory Visit (INDEPENDENT_AMBULATORY_CARE_PROVIDER_SITE_OTHER): Payer: PRIVATE HEALTH INSURANCE | Admitting: Adult Health

## 2016-01-08 VITALS — BP 128/76 | HR 70 | Temp 97.9°F | Ht 61.0 in | Wt 175.0 lb

## 2016-01-08 DIAGNOSIS — G4761 Periodic limb movement disorder: Secondary | ICD-10-CM

## 2016-01-08 MED ORDER — ROPINIROLE HCL 0.5 MG PO TABS: ORAL_TABLET | ORAL | Status: DC

## 2016-01-08 NOTE — Assessment & Plan Note (Signed)
Doing very well on Requip  Plan  Continue on your requip Keep working on weight loss Follow up with Dr. Clayborn Bigness in 1 year and As needed

## 2016-01-08 NOTE — Progress Notes (Signed)
Subjective:    Patient ID: Tracie Wagner, female    DOB: 1956/07/25, 60 y.o.   MRN: [id number]  HPI 60 yo female with RLS.  Former patient of Dr. Shelle Iron   She has HTN and Dyslipidemia.   TEST  NPSG 2011:  PLMS 120, with 1.4/hr with arousal or awakening.  01/08/2016 Follow up : RLS  Pt returns for yearly follow up for RLS .  She is maintained on Requip 0.5mg  2 tabs At bedtime   Pt states requip is working well and has no new complaints.  Feels she is well controlled.  Feels rested and gets good night sleep.  Denies chest pain, leg pain, n/v/d, fever or cough.      Past Medical History  Diagnosis Date  . Hypertension   . Osteoarthritis   . Hypercholesteremia   . Abnormal liver function    Current Outpatient Prescriptions on File Prior to Visit  Medication Sig Dispense Refill  . amLODipine (NORVASC) 5 MG tablet Take 5 mg by mouth daily.      Marland Kitchen aspirin 81 MG tablet Take 81 mg by mouth daily.      . Calcium 1500 MG tablet Take 1,500 mg by mouth daily.      . cholecalciferol (VITAMIN D) 1000 UNITS tablet Take 1,000 Units by mouth daily.      . fish oil-omega-3 fatty acids 1000 MG capsule Take 1 capsule by mouth 2 (two) times daily.      . folic acid (FOLVITE) 800 MCG tablet Take 800 mcg by mouth daily.      . metFORMIN (GLUCOPHAGE) 500 MG tablet Take 500 mg by mouth 2 (two) times daily.      Marland Kitchen rOPINIRole (REQUIP) 0.5 MG tablet TAKE 2 TABLETS (1 MG TOTAL) BY MOUTH AT BEDTIME. 60 tablet 2  . WELCHOL 625 MG tablet Take 2 tablets by mouth Twice daily.    . Multiple Vitamin (MULTIVITAMIN) capsule Take 1 capsule by mouth daily.       No current facility-administered medications on file prior to visit.    Review of Systems Constitutional:   No  weight loss, night sweats,  Fevers, chills, fatigue, or  lassitude.  HEENT:   No headaches,  Difficulty swallowing,  Tooth/dental problems, or  Sore throat,                No sneezing, itching, ear ache, nasal congestion, post nasal  drip,   CV:  No chest pain,  Orthopnea, PND, swelling in lower extremities, anasarca, dizziness, palpitations, syncope.   GI  No heartburn, indigestion, abdominal pain, nausea, vomiting, diarrhea, change in bowel habits, loss of appetite, bloody stools.   Resp: No shortness of breath with exertion or at rest.  No excess mucus, no productive cough,  No non-productive cough,  No coughing up of blood.  No change in color of mucus.  No wheezing.  No chest wall deformity  Skin: no rash or lesions.  GU: no dysuria, change in color of urine, no urgency or frequency.  No flank pain, no hematuria   MS:  No joint pain or swelling.  No decreased range of motion.  No back pain.  Psych:  No change in mood or affect. No depression or anxiety.  No memory loss.         Objective:   Physical Exam   Filed Vitals:   01/08/16 0857  BP: 128/76  Pulse: 70  Temp: 97.9 F (36.6 C)  TempSrc: Oral  Height:   (1.549 m)  Weight: 175 lb (79.379 kg)  SpO2: 96%   Body mass index is 33.08 kg/(m^2).  GEN: A/Ox3; pleasant , NAD, well nourished   HEENT:  [place]/AT,  EACs-clear, TMs-wnl, NOSE-clear, THROAT-clear, no lesions, no postnasal drip or exudate noted.   NECK:  Supple w/ fair ROM; no JVD; normal carotid impulses w/o bruits; no thyromegaly or nodules palpated; no lymphadenopathy.  RESP  Clear  P & A; w/o, wheezes/ rales/ or rhonchi.no accessory muscle use, no dullness to percussion  CARD:  RRR, no m/r/g  , no peripheral edema, pulses intact, no cyanosis or clubbing.  GI:   Soft & nt; nml bowel sounds; no organomegaly or masses detected.  Musco: Warm bil, no deformities or joint swelling noted.   Neuro: alert, no focal deficits noted.    Skin: Warm, no lesions or rashes        Assessment & Plan:

## 2016-01-08 NOTE — Addendum Note (Signed)
Addended by: Karalee Height on: 01/08/2016 09:39 AM   Modules accepted: Orders

## 2016-01-08 NOTE — Patient Instructions (Signed)
Continue on your requip Keep working on weight loss Follow up with Dr. Clayborn Bigness in 1 year and As needed

## 2016-02-01 ENCOUNTER — Other Ambulatory Visit: Payer: Self-pay | Admitting: Adult Health

## 2016-03-04 ENCOUNTER — Other Ambulatory Visit: Payer: Self-pay | Admitting: Adult Health

## 2016-03-05 ENCOUNTER — Other Ambulatory Visit: Payer: Self-pay

## 2016-03-05 MED ORDER — ROPINIROLE HCL 0.5 MG PO TABS: ORAL_TABLET | ORAL | Status: DC

## 2016-07-15 ENCOUNTER — Encounter: Payer: Self-pay | Admitting: Podiatry

## 2016-07-15 ENCOUNTER — Ambulatory Visit (INDEPENDENT_AMBULATORY_CARE_PROVIDER_SITE_OTHER): Payer: Commercial Managed Care - HMO | Admitting: Podiatry

## 2016-07-15 VITALS — BP 146/87 | HR 79 | Resp 16 | Ht 61.75 in | Wt 170.0 lb

## 2016-07-15 DIAGNOSIS — L608 Other nail disorders: Secondary | ICD-10-CM | POA: Diagnosis not present

## 2016-07-15 DIAGNOSIS — L600 Ingrowing nail: Secondary | ICD-10-CM

## 2016-07-15 NOTE — Patient Instructions (Signed)

## 2016-07-15 NOTE — Progress Notes (Signed)
   Subjective:    Patient ID: Tracie LandauMartha M Brierley, female    DOB: Sep 14, 1956, 60 y.o.   MRN: [id number]  HPI Chief Complaint  Patient presents with  . Nail Problem    Left foot; great toe; pt stated, "Wants entire nail removed today"; Pt Diabetic Type 2; Sugar=121 this am; A1C=6.8     Review of Systems  All other systems reviewed and are negative.      Objective:   Physical Exam        Assessment & Plan:

## 2016-07-17 NOTE — Progress Notes (Signed)
Subjective:     Patient ID: Tracie Wagner, female   DOB: Dec 04, 1955, 60 y.o.   MRN: [id number]  HPI patient wants ingrown toenail and thick damaged nail left removed like the one on the right   Review of Systems     Objective:   Physical Exam Neurovascular status intact muscle strength adequate with damaged thickened dystrophic left hallux nail that's painful when pressed and makes shoe gear difficult with complete removal of right noted    Assessment:     Chronic damaged ingrown toenail left with pain    Plan:     H&P condition removed and recommended removal of procedure and nail. Explained what would be done and at this time I infiltrated the left hallux 60 mg Xylocaine Marcaine mixture remove the hallux nail exposed matrix and applied phenol 5 applications 30 seconds followed by alcohol lavage and sterile dressing gave instructions on soaks and reappoint

## 2016-08-16 NOTE — H&P (Signed)
TOTAL KNEE ADMISSION H&P  Patient is being admitted for right total knee arthroplasty.  Subjective:  Chief Complaint:     Right knee primary OA / pain  HPI: Tracie Wagner, 60 y.o. female, has a history of pain and functional disability in the right knee due to arthritis and has failed non-surgical conservative treatments for greater than 12 weeks to include NSAID's and/or analgesics, corticosteriod injections and activity modification.  Onset of symptoms was gradual, starting ~5 years ago with gradually worsening course since that time. The patient noted no past surgery on the right knee(s).  Patient currently rates pain in the right knee(s) at 8 out of 10 with activity. Patient has night pain, worsening of pain with activity and weight bearing, pain that interferes with activities of daily living and pain with passive range of motion.  Patient has evidence of periarticular osteophytes and joint space narrowing by imaging studies. There is no active infection.   Risks, benefits and expectations were discussed with the patient.  Risks including but not limited to the risk of anesthesia, blood clots, nerve damage, blood vessel damage, failure of the prosthesis, infection and up to and including death.  Patient understand the risks, benefits and expectations and wishes to proceed with surgery.   PCP: Astrid Divine, MD  D/C Plans:      Home with HHPT  Post-op Meds:       No Rx given  Tranexamic Acid:      To be given - IV   Decadron:      Is to be given  FYI:     ASA   Norco    Patient Active Problem List   Diagnosis Date Noted  . Ingrown left big toenail 07/15/2016  . Pincer nail deformity 07/15/2016  . PLMD (periodic limb movement disorder) 07/02/2010  . Snoring 07/02/2010  . PURE HYPERCHOLESTEROLEMIA 05/03/2010  . HYPERTENSION, BENIGN ESSENTIAL 05/03/2010  . OSTEOARTHRITIS, GENERALIZED, MULTIPLE JOINTS 05/03/2010  . ELEVATED BLOOD PRESSURE 05/03/2010  . LIVER FUNCTION  TESTS, ABNORMAL, HX OF 05/03/2010   Past Medical History:  Diagnosis Date  . Abnormal liver function   . Hypercholesteremia   . Hypertension   . Osteoarthritis     Past Surgical History:  Procedure Laterality Date  . CHOLECYSTECTOMY  11/1998  . SHOULDER SURGERY      No prescriptions prior to admission.   Allergies  Allergen Reactions  . Ace Inhibitors     REACTION: cough  . Crestor [Rosuvastatin Calcium]     hives  . Demerol [Meperidine]     rash  . Meperidine Hcl     REACTION: rash  . Septra [Sulfamethoxazole-Trimethoprim]     rash    Social History  Substance Use Topics  . Smoking status: Never Smoker  . Smokeless tobacco: Never Used  . Alcohol use Not on file    Family History  Problem Relation Age of Onset  . Heart disease Mother   . Heart disease Sister   . Heart disease Maternal Grandfather      Review of Systems  Constitutional: Negative.   HENT: Negative.   Eyes: Negative.   Respiratory: Negative.   Cardiovascular: Negative.   Gastrointestinal: Negative.   Genitourinary: Negative.   Musculoskeletal: Positive for back pain and joint pain.  Skin: Negative.   Neurological: Negative.   Endo/Heme/Allergies: Negative.   Psychiatric/Behavioral: Negative.     Objective:  Physical Exam  Constitutional: She is oriented to person, place, and time. She appears well-developed.  HENT:  Head: Normocephalic.  Eyes: Pupils are equal, round, and reactive to light.  Neck: Neck supple. No JVD present. No tracheal deviation present. No thyromegaly present.  Cardiovascular: Normal rate, regular rhythm, normal heart sounds and intact distal pulses.   Respiratory: Effort normal and breath sounds normal. No respiratory distress. She has no wheezes.  GI: Soft. There is no tenderness. There is no guarding.  Musculoskeletal:       Right knee: She exhibits decreased range of motion, swelling and bony tenderness. She exhibits no ecchymosis, no deformity, no laceration,  no erythema and normal alignment. Tenderness found.  Lymphadenopathy:    She has no cervical adenopathy.  Neurological: She is alert and oriented to person, place, and time.  Skin: Skin is warm and dry.  Psychiatric: She has a normal mood and affect.     Labs:  Estimated body mass index is 31.35 kg/m as calculated from the following:   Height as of 07/15/16: 5' 1.75" (1.568 m).   Weight as of 07/15/16: 77.1 kg (170 lb).   Imaging Review Plain radiographs demonstrate severe degenerative joint disease of the right knee(s). The overall alignment is neutral. The bone quality appears to be good for age and reported activity level.  Assessment/Plan:  End stage arthritis, right knee   The patient history, physical examination, clinical judgment of the provider and imaging studies are consistent with end stage degenerative joint disease of the right knee(s) and total knee arthroplasty is deemed medically necessary. The treatment options including medical management, injection therapy arthroscopy and arthroplasty were discussed at length. The risks and benefits of total knee arthroplasty were presented and reviewed. The risks due to aseptic loosening, infection, stiffness, patella tracking problems, thromboembolic complications and other imponderables were discussed. The patient acknowledged the explanation, agreed to proceed with the plan and consent was signed. Patient is being admitted for inpatient treatment for surgery, pain control, PT, OT, prophylactic antibiotics, VTE prophylaxis, progressive ambulation and ADL's and discharge planning. The patient is planning to be discharged home with home health services.     Anastasio AuerbachMatthew S. Lagretta Loseke   PA-C  08/16/2016, 11:19 AM

## 2016-08-22 ENCOUNTER — Other Ambulatory Visit (HOSPITAL_COMMUNITY): Payer: Self-pay | Admitting: *Deleted

## 2016-08-22 NOTE — Progress Notes (Signed)
MEDICAL CLEARANCE NOTE DR Consuella LoseELAINE GRIFFIN ON CHART

## 2016-08-22 NOTE — Patient Instructions (Signed)
Kelby AlineMartha M Shober  08/22/2016   Your procedure is scheduled on: 09-02-16  [facility]  [facility] to 3rd floor to  Short Stay Center at 530 AM.  Call this number if you have problems the morning of surgery [phone]   Remember: ONLY 1 PERSON MAY GO WITH YOU TO SHORT STAY TO GET  READY MORNING OF YOUR SURGERY.  Do not eat food or drink liquids :After Midnight.     Take these medicines the morning of surgery with A SIP OF WATER: XANAX IF NEEDED, AMLODIPINE (NORVASC) DO NOT TAKE ANY DIABETIC MEDICATIONS DAY OF YOUR SURGERY                               You may not have any metal on your body including hair pins and              piercings  Do not wear jewelry, make-up, lotions, powders or perfumes, deodorant             Do not wear nail polish.  Do not shave  48 hours prior to surgery.              Men may shave face and neck.   Do not bring valuables to the hospital. [place] IS NOT             RESPONSIBLE   FOR VALUABLES.  Contacts, dentures or bridgework may not be worn into surgery.  Leave suitcase in the car. After surgery it may be brought to your room.                 Please read over the following fact sheets you were given: _____________________________________________________________________             [facility] - Preparing for [facility] Before surgery, you can play an important role.  Because skin is not sterile, your skin needs to be as free of germs as possible.  You can reduce the number of germs on your skin by washing with CHG (chlorahexidine gluconate) soap before surgery.  CHG is an antiseptic cleaner which kills germs and bonds with the skin to continue killing germs even after washing. Please DO NOT use if you have an allergy to CHG or antibacterial soaps.  If your skin becomes reddened/irritated stop using the CHG and inform your nurse when you arrive at Short Stay. Do not shave (including legs and  underarms) for at least 48 hours prior to the first CHG shower.  You may shave your face/neck. Please follow these instructions carefully:  1.  Shower with CHG Soap the night before surgery and the  morning of Surgery.  2.  If you choose to wash your hair, wash your hair first as usual with your  normal  shampoo.  3.  After you shampoo, rinse your hair and body thoroughly to remove the  shampoo.                           4.  Use CHG as you would any other liquid soap.  You can apply chg directly  to the skin and wash                       Gently with  a scrungie or clean washcloth.  5.  Apply the CHG Soap to your body ONLY FROM THE NECK DOWN.   Do not use on face/ open                           Wound or open sores. Avoid contact with eyes, ears mouth and genitals (private parts).                       Wash face,  Genitals (private parts) with your normal soap.             6.  Wash thoroughly, paying special attention to the area where your surgery  will be performed.  7.  Thoroughly rinse your body with warm water from the neck down.  8.  DO NOT shower/wash with your normal soap after using and rinsing off  the CHG Soap.                9.  Pat yourself dry with a clean towel.            10.  Wear clean pajamas.            11.  Place clean sheets on your bed the night of your first shower and do not  sleep with pets. Day of Surgery : Do not apply any lotions/deodorants the morning of surgery.  Please wear clean clothes to the hospital/surgery center.  FAILURE TO FOLLOW THESE INSTRUCTIONS MAY RESULT IN THE CANCELLATION OF YOUR SURGERY PATIENT SIGNATURE_________________________________  NURSE SIGNATURE__________________________________  ________________________________________________________________________   Adam Phenix  An incentive spirometer is a tool that can help keep your lungs clear and active. This tool measures how well you are filling your lungs with each breath. Taking  long deep breaths may help reverse or decrease the chance of developing breathing (pulmonary) problems (especially infection) following:  A long period of time when you are unable to move or be active. BEFORE THE PROCEDURE   If the spirometer includes an indicator to show your best effort, your nurse or respiratory therapist will set it to a desired goal.  If possible, sit up straight or lean slightly forward. Try not to slouch.  Hold the incentive spirometer in an upright position. INSTRUCTIONS FOR USE  1. Sit on the edge of your bed if possible, or sit up as far as you can in bed or on a chair. 2. Hold the incentive spirometer in an upright position. 3. Breathe out normally. 4. Place the mouthpiece in your mouth and seal your lips tightly around it. 5. Breathe in slowly and as deeply as possible, raising the piston or the ball toward the top of the column. 6. Hold your breath for 3-5 seconds or for as long as possible. Allow the piston or ball to fall to the bottom of the column. 7. Remove the mouthpiece from your mouth and breathe out normally. 8. Rest for a few seconds and repeat Steps 1 through 7 at least 10 times every 1-2 hours when you are awake. Take your time and take a few normal breaths between deep breaths. 9. The spirometer may include an indicator to show your best effort. Use the indicator as a goal to work toward during each repetition. 10. After each set of 10 deep breaths, practice coughing to be sure your lungs are clear. If you have an incision (the cut made at the time of surgery), support your  incision when coughing by placing a pillow or rolled up towels firmly against it. Once you are able to get out of bed, walk around indoors and cough well. You may stop using the incentive spirometer when instructed by your caregiver.  RISKS AND COMPLICATIONS  Take your time so you do not get dizzy or light-headed.  If you are in pain, you may need to take or ask for pain  medication before doing incentive spirometry. It is harder to take a deep breath if you are having pain. AFTER USE  Rest and breathe slowly and easily.  It can be helpful to keep track of a log of your progress. Your caregiver can provide you with a simple table to help with this. If you are using the spirometer at home, follow these instructions: [place] IF:   You are having difficultly using the spirometer.  You have trouble using the spirometer as often as instructed.  Your pain medication is not giving enough relief while using the spirometer.  You develop fever of 100.5 F (38.1 C) or higher. SEEK IMMEDIATE MEDICAL CARE IF:   You cough up bloody sputum that had not been present before.  You develop fever of 102 F (38.9 C) or greater.  You develop worsening pain at or near the incision site. MAKE SURE YOU:   Understand these instructions.  Will watch your condition.  Will get help right away if you are not doing well or get worse. Document Released: 03/24/2007 Document Revised: 02/03/2012 Document Reviewed: 05/25/2007 ExitCare Patient Information 2014 ExitCare, [place].   ________________________________________________________________________  WHAT IS A BLOOD TRANSFUSION? Blood Transfusion Information  A transfusion is the replacement of blood or some of its parts. Blood is made up of multiple cells which provide different functions.  Red blood cells carry oxygen and are used for blood loss replacement.  White blood cells fight against infection.  Platelets control bleeding.  Plasma helps clot blood.  Other blood products are available for specialized needs, such as hemophilia or other clotting disorders. BEFORE THE TRANSFUSION  Who gives blood for transfusions?   Healthy volunteers who are fully evaluated to make sure their blood is safe. This is blood bank blood. Transfusion therapy is the safest it has ever been in the practice of medicine.  Before blood is taken from a donor, a complete history is taken to make sure that person has no history of diseases nor engages in risky social behavior (examples are intravenous drug use or sexual activity with multiple partners). The donor's travel history is screened to minimize risk of transmitting infections, such as malaria. The donated blood is tested for signs of infectious diseases, such as HIV and hepatitis. The blood is then tested to be sure it is compatible with you in order to minimize the chance of a transfusion reaction. If you or a relative donates blood, this is often done in anticipation of surgery and is not appropriate for emergency situations. It takes many days to process the donated blood. RISKS AND COMPLICATIONS Although transfusion therapy is very safe and saves many lives, the main dangers of transfusion include:   Getting an infectious disease.  Developing a transfusion reaction. This is an allergic reaction to something in the blood you were given. Every precaution is taken to prevent this. The decision to have a blood transfusion has been considered carefully by your caregiver before blood is given. Blood is not given unless the benefits outweigh the risks. AFTER THE TRANSFUSION  Right  after receiving a blood transfusion, you will usually feel much better and more energetic. This is especially true if your red blood cells have gotten low (anemic). The transfusion raises the level of the red blood cells which carry oxygen, and this usually causes an energy increase.  The nurse administering the transfusion will monitor you carefully for complications. HOME CARE INSTRUCTIONS  No special instructions are needed after a transfusion. You may find your energy is better. Speak with your caregiver about any limitations on activity for underlying diseases you may have. SEEK MEDICAL CARE IF:   Your condition is not improving after your transfusion.  You develop redness or  irritation at the intravenous (IV) site. SEEK IMMEDIATE MEDICAL CARE IF:  Any of the following symptoms occur over the next 12 hours:  Shaking chills.  You have a temperature by mouth above 102 F (38.9 C), not controlled by medicine.  Chest, back, or muscle pain.  People around you feel you are not acting correctly or are confused.  Shortness of breath or difficulty breathing.  Dizziness and fainting.  You get a rash or develop hives.  You have a decrease in urine output.  Your urine turns a dark color or changes to pink, red, or brown. Any of the following symptoms occur over the next 10 days:  You have a temperature by mouth above 102 F (38.9 C), not controlled by medicine.  Shortness of breath.  Weakness after normal activity.  The white part of the eye turns yellow (jaundice).  You have a decrease in the amount of urine or are urinating less often.  Your urine turns a dark color or changes to pink, red, or brown. Document Released: 11/08/2000 Document Revised: 02/03/2012 Document Reviewed: 06/27/2008 [facility] Patient Information 2014 [place], [place].  _______________________________________________________________________

## 2016-08-23 ENCOUNTER — Encounter (HOSPITAL_COMMUNITY): Payer: Self-pay

## 2016-08-23 ENCOUNTER — Encounter (HOSPITAL_COMMUNITY)
Admission: RE | Admit: 2016-08-23 | Discharge: 2016-08-23 | Disposition: A | Discharge status: Home / Self Care | Payer: PRIVATE HEALTH INSURANCE | Source: Ambulatory Visit | Attending: Orthopedic Surgery | Admitting: Orthopedic Surgery

## 2016-08-23 DIAGNOSIS — E119 Type 2 diabetes mellitus without complications: Secondary | ICD-10-CM | POA: Diagnosis not present

## 2016-08-23 DIAGNOSIS — Z01818 Encounter for other preprocedural examination: Secondary | ICD-10-CM | POA: Diagnosis not present

## 2016-08-23 HISTORY — DX: Type 2 diabetes mellitus without complications: E11.9

## 2016-08-23 HISTORY — DX: Pneumonia, unspecified organism: J18.9

## 2016-08-23 HISTORY — DX: Fatty (change of) liver, not elsewhere classified: K76.0

## 2016-08-23 HISTORY — DX: Family history of other specified conditions: Z84.89

## 2016-08-23 HISTORY — DX: Restless legs syndrome: G25.81

## 2016-08-23 LAB — SURGICAL PCR SCREEN
MRSA, PCR: NEGATIVE
Staphylococcus aureus: NEGATIVE

## 2016-08-23 LAB — COMPREHENSIVE METABOLIC PANEL
ALK PHOS: 80 U/L (ref 38–126)
ALT: 107 U/L — AB (ref 14–54)
ANION GAP: 6 (ref 5–15)
AST: 66 U/L — ABNORMAL HIGH (ref 15–41)
Albumin: 4.5 g/dL (ref 3.5–5.0)
BILIRUBIN TOTAL: 0.9 mg/dL (ref 0.3–1.2)
BUN: 12 mg/dL (ref 6–20)
CALCIUM: 9.7 mg/dL (ref 8.9–10.3)
CO2: 26 mmol/L (ref 22–32)
CREATININE: 1.08 mg/dL — AB (ref 0.44–1.00)
Chloride: 109 mmol/L (ref 101–111)
GFR, EST NON AFRICAN AMERICAN: 55 mL/min — AB (ref >60)
Glucose, Bld: 105 mg/dL — ABNORMAL HIGH (ref 65–99)
Potassium: 5.1 mmol/L (ref 3.5–5.1)
Sodium: 141 mmol/L (ref 135–145)
TOTAL PROTEIN: 7.3 g/dL (ref 6.5–8.1)

## 2016-08-23 LAB — CBC
HCT: 45 % (ref 36.0–46.0)
Hemoglobin: 15.4 g/dL — ABNORMAL HIGH (ref 12.0–15.0)
MCH: 30.6 pg (ref 26.0–34.0)
MCHC: 34.2 g/dL (ref 30.0–36.0)
MCV: 89.5 fL (ref 78.0–100.0)
PLATELETS: 240 10*3/uL (ref 150–400)
RBC: 5.03 MIL/uL (ref 3.87–5.11)
RDW: 12.4 % (ref 11.5–15.5)
WBC: 5.7 10*3/uL (ref 4.0–10.5)

## 2016-08-24 LAB — HEMOGLOBIN A1C
Hgb A1c MFr Bld: 7.2 % — ABNORMAL HIGH (ref 4.8–5.6)
Mean Plasma Glucose: 160 mg/dL

## 2016-08-24 LAB — ABO/RH: ABO/RH(D): O POS

## 2016-09-02 ENCOUNTER — Inpatient Hospital Stay (HOSPITAL_COMMUNITY): Payer: PRIVATE HEALTH INSURANCE | Admitting: Certified Registered Nurse Anesthetist

## 2016-09-02 ENCOUNTER — Encounter (HOSPITAL_COMMUNITY)
Admission: RE | Disposition: A | Discharge status: Home Health | Payer: Self-pay | Source: Ambulatory Visit | Attending: Orthopedic Surgery

## 2016-09-02 ENCOUNTER — Inpatient Hospital Stay (HOSPITAL_COMMUNITY)
Admission: RE | Admit: 2016-09-02 | Discharge: 2016-09-03 | DRG: 470 | Disposition: A | Discharge status: Home Health | Payer: PRIVATE HEALTH INSURANCE | Source: Ambulatory Visit | Attending: Orthopedic Surgery | Admitting: Orthopedic Surgery

## 2016-09-02 ENCOUNTER — Encounter (HOSPITAL_COMMUNITY): Payer: Self-pay

## 2016-09-02 DIAGNOSIS — Z96659 Presence of unspecified artificial knee joint: Secondary | ICD-10-CM

## 2016-09-02 DIAGNOSIS — Z6831 Body mass index (BMI) 31.0-31.9, adult: Secondary | ICD-10-CM | POA: Diagnosis not present

## 2016-09-02 DIAGNOSIS — M1711 Unilateral primary osteoarthritis, right knee: Principal | ICD-10-CM | POA: Diagnosis present

## 2016-09-02 DIAGNOSIS — G4761 Periodic limb movement disorder: Secondary | ICD-10-CM | POA: Diagnosis present

## 2016-09-02 DIAGNOSIS — I10 Essential (primary) hypertension: Secondary | ICD-10-CM | POA: Diagnosis present

## 2016-09-02 DIAGNOSIS — M25761 Osteophyte, right knee: Secondary | ICD-10-CM | POA: Diagnosis present

## 2016-09-02 DIAGNOSIS — Z8639 Personal history of other endocrine, nutritional and metabolic disease: Secondary | ICD-10-CM

## 2016-09-02 DIAGNOSIS — Z885 Allergy status to narcotic agent status: Secondary | ICD-10-CM | POA: Diagnosis not present

## 2016-09-02 DIAGNOSIS — Z881 Allergy status to other antibiotic agents status: Secondary | ICD-10-CM | POA: Diagnosis not present

## 2016-09-02 DIAGNOSIS — L600 Ingrowing nail: Secondary | ICD-10-CM | POA: Diagnosis present

## 2016-09-02 DIAGNOSIS — R0683 Snoring: Secondary | ICD-10-CM | POA: Diagnosis present

## 2016-09-02 DIAGNOSIS — Z888 Allergy status to other drugs, medicaments and biological substances status: Secondary | ICD-10-CM | POA: Diagnosis not present

## 2016-09-02 DIAGNOSIS — L608 Other nail disorders: Secondary | ICD-10-CM | POA: Diagnosis present

## 2016-09-02 DIAGNOSIS — M659 Synovitis and tenosynovitis, unspecified: Secondary | ICD-10-CM | POA: Diagnosis present

## 2016-09-02 DIAGNOSIS — E119 Type 2 diabetes mellitus without complications: Secondary | ICD-10-CM | POA: Diagnosis present

## 2016-09-02 DIAGNOSIS — E669 Obesity, unspecified: Secondary | ICD-10-CM | POA: Diagnosis present

## 2016-09-02 DIAGNOSIS — R262 Difficulty in walking, not elsewhere classified: Secondary | ICD-10-CM

## 2016-09-02 DIAGNOSIS — Z8249 Family history of ischemic heart disease and other diseases of the circulatory system: Secondary | ICD-10-CM | POA: Diagnosis not present

## 2016-09-02 DIAGNOSIS — M159 Polyosteoarthritis, unspecified: Secondary | ICD-10-CM | POA: Diagnosis present

## 2016-09-02 DIAGNOSIS — Z96651 Presence of right artificial knee joint: Secondary | ICD-10-CM

## 2016-09-02 DIAGNOSIS — G2581 Restless legs syndrome: Secondary | ICD-10-CM | POA: Diagnosis present

## 2016-09-02 DIAGNOSIS — Z886 Allergy status to analgesic agent status: Secondary | ICD-10-CM

## 2016-09-02 HISTORY — PX: TOTAL KNEE ARTHROPLASTY: SHX125

## 2016-09-02 LAB — GLUCOSE, CAPILLARY
Glucose-Capillary: 147 mg/dL — ABNORMAL HIGH (ref 65–99)
Glucose-Capillary: 154 mg/dL — ABNORMAL HIGH (ref 65–99)
Glucose-Capillary: 299 mg/dL — ABNORMAL HIGH (ref 65–99)
Glucose-Capillary: 277 mg/dL — ABNORMAL HIGH (ref 65–99)
Glucose-Capillary: 264 mg/dL — ABNORMAL HIGH (ref 65–99)

## 2016-09-02 LAB — TYPE AND SCREEN
ABO/RH(D): O POS
ANTIBODY SCREEN: NEGATIVE

## 2016-09-02 SURGERY — ARTHROPLASTY, KNEE, TOTAL
Anesthesia: Spinal | Site: Knee | Laterality: Right

## 2016-09-02 MED ORDER — CEFAZOLIN SODIUM-DEXTROSE 2-4 GM/100ML-% IV SOLN
INTRAVENOUS | Status: AC
  Filled 2016-09-02: qty 100

## 2016-09-02 MED ORDER — PROPOFOL 10 MG/ML IV BOLUS
INTRAVENOUS | Status: AC
  Filled 2016-09-02: qty 40

## 2016-09-02 MED ORDER — METOCLOPRAMIDE HCL 5 MG PO TABS: 5.0000 mg | ORAL_TABLET | Freq: Three times a day (TID) | ORAL | Status: DC | PRN

## 2016-09-02 MED ORDER — PROPOFOL 10 MG/ML IV BOLUS
INTRAVENOUS | Status: AC
  Filled 2016-09-02: qty 20

## 2016-09-02 MED ORDER — DOCUSATE SODIUM 100 MG PO CAPS: 100.0000 mg | ORAL_CAPSULE | Freq: Two times a day (BID) | ORAL | 0 refills | Status: DC

## 2016-09-02 MED ORDER — FENTANYL CITRATE (PF) 100 MCG/2ML IJ SOLN
INTRAMUSCULAR | Status: AC
  Filled 2016-09-02: qty 2

## 2016-09-02 MED ORDER — HYDROCODONE-ACETAMINOPHEN 7.5-325 MG PO TABS: 1.0000 | ORAL_TABLET | ORAL | 0 refills | Status: AC | PRN

## 2016-09-02 MED ORDER — PROPOFOL 500 MG/50ML IV EMUL
INTRAVENOUS | Status: DC | PRN
  Administered 2016-09-02: 75 ug/kg/min via INTRAVENOUS

## 2016-09-02 MED ORDER — COLESEVELAM HCL 625 MG PO TABS
1250.0000 mg | ORAL_TABLET | Freq: Two times a day (BID) | ORAL | Status: DC
Start: 2016-09-02 — End: 2016-09-03
  Administered 2016-09-02 – 2016-09-03 (×3): 1250 mg via ORAL
  Filled 2016-09-02 (×4): qty 2

## 2016-09-02 MED ORDER — STERILE WATER FOR IRRIGATION IR SOLN
Status: DC | PRN
  Administered 2016-09-02: 2000 mL

## 2016-09-02 MED ORDER — ONDANSETRON HCL 4 MG/2ML IJ SOLN
INTRAMUSCULAR | Status: DC | PRN
Start: 2016-09-02 — End: 2016-09-02
  Administered 2016-09-02: 4 mg via INTRAVENOUS

## 2016-09-02 MED ORDER — HYDROMORPHONE HCL 1 MG/ML IJ SOLN
0.5000 mg | INTRAMUSCULAR | Status: DC | PRN
  Administered 2016-09-02: 2 mg via INTRAVENOUS
  Administered 2016-09-02: 1 mg via INTRAVENOUS
  Filled 2016-09-02: qty 1
  Filled 2016-09-02: qty 2

## 2016-09-02 MED ORDER — ROPINIROLE HCL 1 MG PO TABS
1.0000 mg | ORAL_TABLET | Freq: Every day | ORAL | Status: DC
  Administered 2016-09-02: 1 mg via ORAL
  Filled 2016-09-02: qty 1

## 2016-09-02 MED ORDER — EPHEDRINE 5 MG/ML INJ
INTRAVENOUS | Status: AC
  Filled 2016-09-02: qty 10

## 2016-09-02 MED ORDER — METFORMIN HCL 500 MG PO TABS
500.0000 mg | ORAL_TABLET | Freq: Two times a day (BID) | ORAL | Status: DC
  Administered 2016-09-02 – 2016-09-03 (×2): 500 mg via ORAL
  Filled 2016-09-02 (×2): qty 1

## 2016-09-02 MED ORDER — BUPIVACAINE IN DEXTROSE 0.75-8.25 % IT SOLN
INTRATHECAL | Status: DC | PRN
  Administered 2016-09-02: 1.6 mL via INTRATHECAL

## 2016-09-02 MED ORDER — FENTANYL CITRATE (PF) 100 MCG/2ML IJ SOLN
INTRAMUSCULAR | Status: DC | PRN
  Administered 2016-09-02 (×4): 50 ug via INTRAVENOUS

## 2016-09-02 MED ORDER — DEXAMETHASONE SODIUM PHOSPHATE 10 MG/ML IJ SOLN
10.0000 mg | Freq: Once | INTRAMUSCULAR | Status: AC
Start: 2016-09-03 — End: 2016-09-03
  Administered 2016-09-03: 10 mg via INTRAVENOUS
  Filled 2016-09-02: qty 1

## 2016-09-02 MED ORDER — EPHEDRINE SULFATE 50 MG/ML IJ SOLN
INTRAMUSCULAR | Status: DC | PRN
  Administered 2016-09-02 (×2): 5 mg via INTRAVENOUS

## 2016-09-02 MED ORDER — ALUM & MAG HYDROXIDE-SIMETH 200-200-20 MG/5ML PO SUSP: 30.0000 mL | ORAL | Status: DC | PRN

## 2016-09-02 MED ORDER — SODIUM CHLORIDE 0.9 % IJ SOLN
INTRAMUSCULAR | Status: DC | PRN
  Administered 2016-09-02: 29 mL

## 2016-09-02 MED ORDER — LACTATED RINGERS IV SOLN
INTRAVENOUS | Status: DC | PRN
  Administered 2016-09-02 (×2): via INTRAVENOUS

## 2016-09-02 MED ORDER — DEXAMETHASONE SODIUM PHOSPHATE 10 MG/ML IJ SOLN
INTRAMUSCULAR | Status: AC
  Filled 2016-09-02: qty 1

## 2016-09-02 MED ORDER — BUPIVACAINE HCL (PF) 0.25 % IJ SOLN
INTRAMUSCULAR | Status: AC
  Filled 2016-09-02: qty 30

## 2016-09-02 MED ORDER — CEFAZOLIN SODIUM-DEXTROSE 2-4 GM/100ML-% IV SOLN
2.0000 g | INTRAVENOUS | Status: AC
  Administered 2016-09-02: 2 g via INTRAVENOUS

## 2016-09-02 MED ORDER — MAGNESIUM CITRATE PO SOLN: 1.0000 | Freq: Once | ORAL | Status: DC | PRN

## 2016-09-02 MED ORDER — ONDANSETRON HCL 4 MG PO TABS: 4.0000 mg | ORAL_TABLET | Freq: Four times a day (QID) | ORAL | Status: DC | PRN

## 2016-09-02 MED ORDER — POLYETHYLENE GLYCOL 3350 17 G PO PACK: 17.0000 g | PACK | Freq: Two times a day (BID) | ORAL | 0 refills | Status: DC

## 2016-09-02 MED ORDER — METOCLOPRAMIDE HCL 5 MG/ML IJ SOLN: 5.0000 mg | Freq: Three times a day (TID) | INTRAMUSCULAR | Status: DC | PRN

## 2016-09-02 MED ORDER — 0.9 % SODIUM CHLORIDE (POUR BTL) OPTIME
TOPICAL | Status: DC | PRN
  Administered 2016-09-02: 1000 mL

## 2016-09-02 MED ORDER — SODIUM CHLORIDE 0.9 % IV SOLN
INTRAVENOUS | Status: DC
  Administered 2016-09-02: 11:00:00 via INTRAVENOUS

## 2016-09-02 MED ORDER — MIDAZOLAM HCL 2 MG/2ML IJ SOLN
INTRAMUSCULAR | Status: AC
  Filled 2016-09-02: qty 2

## 2016-09-02 MED ORDER — HYDROMORPHONE HCL 1 MG/ML IJ SOLN
0.2500 mg | INTRAMUSCULAR | Status: DC | PRN
  Administered 2016-09-02 (×4): 0.5 mg via INTRAVENOUS

## 2016-09-02 MED ORDER — ONDANSETRON HCL 4 MG/2ML IJ SOLN: 4.0000 mg | Freq: Four times a day (QID) | INTRAMUSCULAR | Status: DC | PRN

## 2016-09-02 MED ORDER — CEFAZOLIN SODIUM-DEXTROSE 2-4 GM/100ML-% IV SOLN
2.0000 g | Freq: Four times a day (QID) | INTRAVENOUS | Status: AC
  Administered 2016-09-02 (×2): 2 g via INTRAVENOUS
  Filled 2016-09-02 (×2): qty 100

## 2016-09-02 MED ORDER — DEXTROSE 5 % IV SOLN
500.0000 mg | Freq: Four times a day (QID) | INTRAVENOUS | Status: DC | PRN
  Administered 2016-09-02: 500 mg via INTRAVENOUS
  Filled 2016-09-02 (×2): qty 5

## 2016-09-02 MED ORDER — AMLODIPINE BESYLATE 5 MG PO TABS
5.0000 mg | ORAL_TABLET | Freq: Every day | ORAL | Status: DC
  Administered 2016-09-03: 5 mg via ORAL
  Filled 2016-09-02: qty 1

## 2016-09-02 MED ORDER — SODIUM CHLORIDE 0.9 % IV SOLN
1000.0000 mg | INTRAVENOUS | Status: AC
  Administered 2016-09-02: 1000 mg via INTRAVENOUS
  Filled 2016-09-02: qty 1100

## 2016-09-02 MED ORDER — SODIUM CHLORIDE 0.9 % IJ SOLN
INTRAMUSCULAR | Status: AC
  Filled 2016-09-02: qty 50

## 2016-09-02 MED ORDER — ASPIRIN 81 MG PO CHEW
81.0000 mg | CHEWABLE_TABLET | Freq: Two times a day (BID) | ORAL | Status: DC
  Administered 2016-09-02 – 2016-09-03 (×2): 81 mg via ORAL
  Filled 2016-09-02 (×2): qty 1

## 2016-09-02 MED ORDER — DIPHENHYDRAMINE HCL 25 MG PO CAPS: 25.0000 mg | ORAL_CAPSULE | Freq: Four times a day (QID) | ORAL | Status: DC | PRN

## 2016-09-02 MED ORDER — ASPIRIN 81 MG PO CHEW: 81.0000 mg | CHEWABLE_TABLET | Freq: Two times a day (BID) | ORAL | 0 refills | Status: AC

## 2016-09-02 MED ORDER — METHOCARBAMOL 500 MG PO TABS: 500.0000 mg | ORAL_TABLET | Freq: Four times a day (QID) | ORAL | 0 refills | Status: DC | PRN

## 2016-09-02 MED ORDER — MIDAZOLAM HCL 5 MG/5ML IJ SOLN
INTRAMUSCULAR | Status: DC | PRN
  Administered 2016-09-02 (×2): 1 mg via INTRAVENOUS

## 2016-09-02 MED ORDER — DOCUSATE SODIUM 100 MG PO CAPS
100.0000 mg | ORAL_CAPSULE | Freq: Two times a day (BID) | ORAL | Status: DC
  Administered 2016-09-02 – 2016-09-03 (×3): 100 mg via ORAL
  Filled 2016-09-02 (×3): qty 1

## 2016-09-02 MED ORDER — SODIUM CHLORIDE 0.9 % IR SOLN
Status: DC | PRN
  Administered 2016-09-02: 1000 mL

## 2016-09-02 MED ORDER — LACTATED RINGERS IV SOLN: INTRAVENOUS | Status: DC

## 2016-09-02 MED ORDER — MENTHOL 3 MG MT LOZG: 1.0000 | LOZENGE | OROMUCOSAL | Status: DC | PRN

## 2016-09-02 MED ORDER — FERROUS SULFATE 325 (65 FE) MG PO TABS: 325.0000 mg | ORAL_TABLET | Freq: Three times a day (TID) | ORAL | 3 refills | Status: DC

## 2016-09-02 MED ORDER — FERROUS SULFATE 325 (65 FE) MG PO TABS
325.0000 mg | ORAL_TABLET | Freq: Three times a day (TID) | ORAL | Status: DC
  Administered 2016-09-02 – 2016-09-03 (×4): 325 mg via ORAL
  Filled 2016-09-02 (×4): qty 1

## 2016-09-02 MED ORDER — DEXAMETHASONE SODIUM PHOSPHATE 10 MG/ML IJ SOLN
10.0000 mg | Freq: Once | INTRAMUSCULAR | Status: AC
  Administered 2016-09-02: 10 mg via INTRAVENOUS

## 2016-09-02 MED ORDER — HYDROMORPHONE HCL 1 MG/ML IJ SOLN
INTRAMUSCULAR | Status: AC
  Filled 2016-09-02: qty 1

## 2016-09-02 MED ORDER — BUPIVACAINE HCL (PF) 0.25 % IJ SOLN
INTRAMUSCULAR | Status: DC | PRN
  Administered 2016-09-02: 30 mL

## 2016-09-02 MED ORDER — KETOROLAC TROMETHAMINE 30 MG/ML IJ SOLN
INTRAMUSCULAR | Status: DC | PRN
  Administered 2016-09-02: 30 mg

## 2016-09-02 MED ORDER — BISACODYL 10 MG RE SUPP: 10.0000 mg | Freq: Every day | RECTAL | Status: DC | PRN

## 2016-09-02 MED ORDER — PROMETHAZINE HCL 25 MG/ML IJ SOLN: 6.2500 mg | INTRAMUSCULAR | Status: DC | PRN

## 2016-09-02 MED ORDER — METHOCARBAMOL 500 MG PO TABS
500.0000 mg | ORAL_TABLET | Freq: Four times a day (QID) | ORAL | Status: DC | PRN
  Administered 2016-09-02: 500 mg via ORAL
  Filled 2016-09-02: qty 1

## 2016-09-02 MED ORDER — KETOROLAC TROMETHAMINE 30 MG/ML IJ SOLN
INTRAMUSCULAR | Status: AC
  Filled 2016-09-02: qty 1

## 2016-09-02 MED ORDER — ALPRAZOLAM 0.5 MG PO TABS: 0.5000 mg | ORAL_TABLET | Freq: Two times a day (BID) | ORAL | Status: DC | PRN

## 2016-09-02 MED ORDER — MEPERIDINE HCL 50 MG/ML IJ SOLN: 6.2500 mg | INTRAMUSCULAR | Status: DC | PRN

## 2016-09-02 MED ORDER — ONDANSETRON HCL 4 MG/2ML IJ SOLN
INTRAMUSCULAR | Status: AC
  Filled 2016-09-02: qty 2

## 2016-09-02 MED ORDER — INSULIN ASPART 100 UNIT/ML ~~LOC~~ SOLN
0.0000 [IU] | Freq: Three times a day (TID) | SUBCUTANEOUS | Status: DC
Start: 2016-09-02 — End: 2016-09-03
  Administered 2016-09-02 (×2): 8 [IU] via SUBCUTANEOUS
  Administered 2016-09-03: 5 [IU] via SUBCUTANEOUS
  Administered 2016-09-03: 3 [IU] via SUBCUTANEOUS

## 2016-09-02 MED ORDER — PHENOL 1.4 % MT LIQD
1.0000 | OROMUCOSAL | Status: DC | PRN
  Filled 2016-09-02: qty 177

## 2016-09-02 MED ORDER — POLYETHYLENE GLYCOL 3350 17 G PO PACK
17.0000 g | PACK | Freq: Two times a day (BID) | ORAL | Status: DC
  Administered 2016-09-02 – 2016-09-03 (×3): 17 g via ORAL
  Filled 2016-09-02 (×3): qty 1

## 2016-09-02 MED ORDER — HYDROCODONE-ACETAMINOPHEN 7.5-325 MG PO TABS
1.0000 | ORAL_TABLET | ORAL | Status: DC
  Administered 2016-09-02 – 2016-09-03 (×7): 2 via ORAL
  Filled 2016-09-02 (×7): qty 2

## 2016-09-02 SURGICAL SUPPLY — 47 items
ADH SKN CLS APL DERMABOND .7 (GAUZE/BANDAGES/DRESSINGS) ×1
BAG SPEC THK2 15X12 ZIP CLS (MISCELLANEOUS) ×1
BAG ZIPLOCK 12X15 (MISCELLANEOUS) ×1 IMPLANT
BANDAGE ACE 6X5 VEL STRL LF (GAUZE/BANDAGES/DRESSINGS) ×2 IMPLANT
BLADE SAW SGTL 13.0X1.19X90.0M (BLADE) ×2 IMPLANT
BONE CEMENT GENTAMICIN (Cement) ×4 IMPLANT
BOWL SMART MIX CTS (DISPOSABLE) ×2 IMPLANT
CAPT KNEE TOTAL 3 ATTUNE ×1 IMPLANT
CEMENT BONE GENTAMICIN 40 (Cement) IMPLANT
CLOTH BEACON ORANGE TIMEOUT ST (SAFETY) ×2 IMPLANT
CUFF TOURN SGL QUICK 34 (TOURNIQUET CUFF) ×2
CUFF TRNQT CYL 34X4X40X1 (TOURNIQUET CUFF) ×1 IMPLANT
DECANTER SPIKE VIAL GLASS SM (MISCELLANEOUS) ×2 IMPLANT
DERMABOND ADVANCED (GAUZE/BANDAGES/DRESSINGS) ×1
DERMABOND ADVANCED .7 DNX12 (GAUZE/BANDAGES/DRESSINGS) ×1 IMPLANT
DRAPE U-SHAPE 47X51 STRL (DRAPES) ×2 IMPLANT
DRESSING AQUACEL AG SP 3.5X10 (GAUZE/BANDAGES/DRESSINGS) ×1 IMPLANT
DRSG AQUACEL AG SP 3.5X10 (GAUZE/BANDAGES/DRESSINGS) ×2
DURAPREP 26ML APPLICATOR (WOUND CARE) ×4 IMPLANT
ELECT REM PT RETURN 9FT ADLT (ELECTROSURGICAL) ×2
ELECTRODE REM PT RTRN 9FT ADLT (ELECTROSURGICAL) ×1 IMPLANT
GLOVE BIOGEL PI IND STRL 6.5 (GLOVE) IMPLANT
GLOVE BIOGEL PI IND STRL 7.5 (GLOVE) ×1 IMPLANT
GLOVE BIOGEL PI IND STRL 8 (GLOVE) IMPLANT
GLOVE BIOGEL PI INDICATOR 6.5 (GLOVE) ×1
GLOVE BIOGEL PI INDICATOR 7.5 (GLOVE) ×4
GLOVE BIOGEL PI INDICATOR 8 (GLOVE) ×2
GLOVE ECLIPSE 8.0 STRL XLNG CF (GLOVE) ×3 IMPLANT
GLOVE ORTHO TXT STRL SZ7.5 (GLOVE) ×4 IMPLANT
GLOVE SURG SS PI 6.5 STRL IVOR (GLOVE) ×1 IMPLANT
GOWN STRL REUS W/TWL LRG LVL3 (GOWN DISPOSABLE) ×2 IMPLANT
GOWN STRL REUS W/TWL XL LVL3 (GOWN DISPOSABLE) ×4 IMPLANT
HANDPIECE INTERPULSE COAX TIP (DISPOSABLE) ×2
MANIFOLD NEPTUNE II (INSTRUMENTS) ×2 IMPLANT
PACK TOTAL KNEE CUSTOM (KITS) ×2 IMPLANT
POSITIONER SURGICAL ARM (MISCELLANEOUS) ×2 IMPLANT
SET HNDPC FAN SPRY TIP SCT (DISPOSABLE) ×1 IMPLANT
SET PAD KNEE POSITIONER (MISCELLANEOUS) ×2 IMPLANT
SUT MNCRL AB 4-0 PS2 18 (SUTURE) ×2 IMPLANT
SUT VIC AB 1 CT1 36 (SUTURE) ×2 IMPLANT
SUT VIC AB 2-0 CT1 27 (SUTURE) ×6
SUT VIC AB 2-0 CT1 TAPERPNT 27 (SUTURE) ×3 IMPLANT
SUT VLOC 180 0 24IN GS25 (SUTURE) ×2 IMPLANT
SYR 50ML LL SCALE MARK (SYRINGE) ×2 IMPLANT
TRAY FOLEY CATH SILVER 14FR (SET/KITS/TRAYS/PACK) ×1 IMPLANT
WRAP KNEE MAXI GEL POST OP (GAUZE/BANDAGES/DRESSINGS) ×2 IMPLANT
YANKAUER SUCT BULB TIP 10FT TU (MISCELLANEOUS) ×2 IMPLANT

## 2016-09-02 NOTE — Discharge Instructions (Signed)

## 2016-09-02 NOTE — Op Note (Signed)
NAME:  Tracie Wagner [facility]                      MEDICAL RECORD NO.:  [id number]                             FACILITY:  [facility]      PHYSICIAN:  Madlyn Frankel. Charlann Boxer, M.D.  DATE OF BIRTH:  07-22-1956      DATE OF PROCEDURE:  09/02/2016                                     OPERATIVE REPORT         PREOPERATIVE DIAGNOSIS:  Right knee osteoarthritis.      POSTOPERATIVE DIAGNOSIS:  Right knee osteoarthritis.      FINDINGS:  The patient was noted to have complete loss of cartilage and   bone-on-bone arthritis with associated osteophytes in the medial and patellofemoral compartments of   the knee with a significant synovitis and associated effusion.      PROCEDURE:  Right total knee replacement.      COMPONENTS USED:  DePuy Attune rotating platform posterior stabilized knee   system, a size 4 femur, 4 tibia, size 7 PS AOX insert, and 32 anatomic patellar   button.      SURGEON:  Madlyn Frankel. Charlann Boxer, M.D.      ASSISTANT:  Leilani Able, PA-C.      ANESTHESIA:  Spinal.      SPECIMENS:  None.      COMPLICATION:  None.      DRAINS:  None.  EBL: <50cc      TOURNIQUET TIME:   Total Tourniquet Time Documented: Thigh (Right) - 41 minutes Total: Thigh (Right) - 41 minutes  .      The patient was stable to the recovery room.      INDICATION FOR PROCEDURE:  Tracie Wagner is a 60 y.o. female patient of   mine.  The patient had been seen, evaluated, and treated conservatively in the   office with medication, activity modification, and injections.  The patient had   radiographic changes of bone-on-bone arthritis with endplate sclerosis and osteophytes noted.      The patient failed conservative measures including medication, injections, and activity modification, and at this point was ready for more definitive measures.   Based on the radiographic changes and failed conservative measures, the patient   decided to proceed with total knee replacement.  Risks of infection,   DVT, component  failure, need for revision surgery, postop course, and   expectations were all   discussed and reviewed.  Consent was obtained for benefit of pain   relief.      PROCEDURE IN DETAIL:  The patient was brought to the operative theater.   Once adequate anesthesia, preoperative antibiotics, 2 gm of Ancef, 1 gm of Tranexamic Acid, and 10 mg of Decadron administered, the patient was positioned supine with the right thigh tourniquet placed.  The  right lower extremity was prepped and draped in sterile fashion.  A time-   out was performed identifying the patient, planned procedure, and   extremity.      The right lower extremity was placed in the [facility] leg holder.  The leg was   exsanguinated, tourniquet elevated to 250 mmHg.  A midline incision was   made followed  by median parapatellar arthrotomy.  Following initial   exposure, attention was first directed to the patella.  Precut   measurement was noted to be 20 mm.  I resected down to 13 mm and used a   32 anatomic patellar button to restore patellar height as well as cover the cut   surface.      The lug holes were drilled and a metal shim was placed to protect the   patella from retractors and saw blades.      At this point, attention was now directed to the femur.  The femoral   canal was opened with a drill, irrigated to try to prevent fat emboli.  An   intramedullary rod was passed at 3 degrees valgus, 9 mm of bone was   resected off the distal femur.  Following this resection, the tibia was   subluxated anteriorly.  Using the extramedullary guide, 2 mm of bone was resected off   the proximal medial tibia.  We confirmed the gap would be   stable medially and laterally with a size 6 spacer block as well as confirmed   the cut was perpendicular in the coronal plane, checking with an alignment rod.      Once this was done, I sized the femur to be a size 4 in the anterior-   posterior dimension, chose a standard component based on  medial and   lateral dimension.  The size 4 rotation block was then pinned in   position anterior referenced using the C-clamp to set rotation.  The   anterior, posterior, and  chamfer cuts were made without difficulty nor   notching making certain that I was along the anterior cortex to help   with flexion gap stability.      The final box cut was made off the lateral aspect of distal femur.      At this point, the tibia was sized to be a size 4, the size 4 tray was   then pinned in position through the medial third of the tubercle,   drilled, and keel punched.  Trial reduction was now carried with a 4 femur,  4 tibia, a size 6 then 7 PS insert, and the 32 anatomic patella botton.  The knee was brought to   extension, full extension with good flexion stability with the patella   tracking through the trochlea without application of pressure.  Given   all these findings the femoral lug holes were drilled and then the trial components removed.  Final components were   opened and cement was mixed.  The knee was irrigated with normal saline   solution and pulse lavage.  The synovial lining was   then injected with 30 cc of 0.5% Marcaine with epinephrine and 1 cc of Toradol plus 30 cc of NS for a  total of 61 cc.      The knee was irrigated.  Final implants were then cemented onto clean and   dried cut surfaces of bone with the knee brought to extension with a size 7 trial insert.      Once the cement had fully cured, the excess cement was removed   throughout the knee.  I confirmed I was satisfied with the range of   motion and stability, and the final size 7 PS AOX insert was chosen.  It was   placed into the knee.      The tourniquet had been let down at 41 minutes.  No significant   hemostasis required.  The   extensor mechanism was then reapproximated using #1 Vicryl and #0 V-lock sutures with the knee   in flexion.  The   remaining wound was closed with 2-0 Vicryl and running 4-0  Monocryl.   The knee was cleaned, dried, dressed sterilely using Dermabond and   Aquacel dressing.  The patient was then   brought to recovery room in stable condition, tolerating the procedure   well.   Please note that Physician Assistant, Leilani AbleSteve Chabon, PA-C, was present for the entirety of the case, and was utilized for pre-operative positioning, peri-operative retractor management, general facilitation of the procedure.  He was also utilized for primary wound closure at the end of the case.              Madlyn FrankelMatthew D. Charlann Boxerlin, M.D.    09/02/2016 8:52 AM

## 2016-09-02 NOTE — Interval H&P Note (Signed)
History and Physical Interval Note:  09/02/2016 7:21 AM  Tracie Wagner  has presented today for surgery, with the diagnosis of right knee osteoarthritis  The various methods of treatment have been discussed with the patient and family. After consideration of risks, benefits and other options for treatment, the patient has consented to  Procedure(s): RIGHT TOTAL KNEE ARTHROPLASTY (Right) as a surgical intervention .  The patient's history has been reviewed, patient examined, no change in status, stable for surgery.  I have reviewed the patient's chart and labs.  Questions were answered to the patient's satisfaction.     Shelda PalLIN,Makhiya Coburn D

## 2016-09-02 NOTE — Anesthesia Procedure Notes (Signed)
Spinal  End time: 09/02/2016 7:34 AM Staffing Anesthesiologist: Shona SimpsonHOLLIS, Tracie D Resident/CRNA: Tracie DikesPETERS, Tracie Ellwanger J Performed: resident/CRNA  Preanesthetic Checklist Completed: patient identified, site marked, surgical consent, pre-op evaluation, timeout performed, IV checked, risks and benefits discussed and monitors and equipment checked Spinal Block Patient position: sitting Prep: Betadine Patient monitoring: heart rate, continuous pulse ox and blood pressure Approach: midline Location: L3-4 Injection technique: single-shot Needle Needle type: Sprotte  Needle gauge: 24 G Needle length: 10 cm Additional Notes Sitting for SAB placement. LOT # [id number], Expiration date 10-24-2017. CSF free flow, no apparent complications noted.

## 2016-09-02 NOTE — Anesthesia Postprocedure Evaluation (Signed)
Anesthesia Post Note  Patient: Tracie Wagner  Procedure(s) Performed: Procedure(s) (LRB): RIGHT TOTAL KNEE ARTHROPLASTY (Right)  Patient location during evaluation: PACU Anesthesia Type: Spinal Level of consciousness: oriented and awake and alert Pain management: pain level controlled Vital Signs Assessment: post-procedure vital signs reviewed and stable Respiratory status: spontaneous breathing, respiratory function stable and patient connected to nasal cannula oxygen Cardiovascular status: blood pressure returned to baseline and stable Postop Assessment: no headache, no backache and spinal receding Anesthetic complications: no    Last Vitals:  Vitals:   09/02/16 1008 09/02/16 1100  BP: 112/68 116/68  Pulse: 60 68  Resp: 15 16  Temp: 36.8 C 36.8 C    Last Pain:  Vitals:   09/02/16 1100  TempSrc: Oral  PainSc:                  Shelton SilvasKevin D Alonza Knisley

## 2016-09-02 NOTE — Anesthesia Preprocedure Evaluation (Addendum)
Anesthesia Evaluation  Patient identified by MRN, date of birth, ID band Patient awake    Reviewed: Allergy & Precautions, NPO status , Patient's Chart, lab work & pertinent test results  Airway Mallampati: II  TM Distance: >3 FB Neck ROM: Full    Dental  (+) Teeth Intact, Dental Advisory Given   Pulmonary neg pulmonary ROS,    breath sounds clear to auscultation       Cardiovascular hypertension, Pt. on medications  Rhythm:Regular Rate:Normal     Neuro/Psych negative neurological ROS  negative psych ROS   GI/Hepatic negative GI ROS, Neg liver ROS,   Endo/Other  diabetes, Type 2, Oral Hypoglycemic Agents  Renal/GU negative Renal ROS  negative genitourinary   Musculoskeletal  (+) Arthritis , Osteoarthritis,    Abdominal   Peds negative pediatric ROS (+)  Hematology negative hematology ROS (+)   Anesthesia Other Findings   Reproductive/Obstetrics negative OB ROS                            Lab Results  Component Value Date   WBC 5.7 08/23/2016   HGB 15.4 (H) 08/23/2016   HCT 45.0 08/23/2016   MCV 89.5 08/23/2016   PLT 240 08/23/2016   Lab Results  Component Value Date   CREATININE 1.08 (H) 08/23/2016   BUN 12 08/23/2016   NA 141 08/23/2016   K 5.1 08/23/2016   CL 109 08/23/2016   CO2 26 08/23/2016   No results found for: INR, PROTIME  07/2016 EKG: normal sinus rhythm.  Anesthesia Physical Anesthesia Plan  ASA: II  Anesthesia Plan: Spinal   Post-op Pain Management:    Induction: Intravenous  Airway Management Planned: Natural Airway  Additional Equipment:   Intra-op Plan:   Post-operative Plan:   Informed Consent: I have reviewed the patients History and Physical, chart, labs and discussed the procedure including the risks, benefits and alternatives for the proposed anesthesia with the patient or authorized representative who has indicated his/her understanding and  acceptance.   Dental advisory given and History available from chart only  Plan Discussed with: CRNA  Anesthesia Plan Comments:         Anesthesia Quick Evaluation

## 2016-09-02 NOTE — Evaluation (Signed)
Physical Therapy Evaluation Patient Details Name: Tracie Wagner MRN: [id number] DOB: May 20, 1956 Today's Date: 09/02/2016   History of Present Illness  60 yo female s/p R TKA 09/01/16  Clinical Impression  On eval POD 0, pt required Min assist for mobility. She walked ~25 feet with a RW. Pain rated 6/10 with activity. Will follow and progress activity as tolerated.     Follow Up Recommendations Home health PT    Equipment Recommendations  Rolling walker with 5" wheels    Recommendations for Other Services       Precautions / Restrictions Precautions Precautions: Knee;Fall Restrictions Weight Bearing Restrictions: No RLE Weight Bearing: Weight bearing as tolerated      Mobility  Bed Mobility Overal bed mobility: Needs Assistance Bed Mobility: Supine to Sit     Supine to sit: Min assist     General bed mobility comments: Assist for R LE.   Transfers Overall transfer level: Needs assistance Equipment used: Rolling walker (2 wheeled) Transfers: Sit to/from Stand Sit to Stand: Min assist         General transfer comment: Assist to rise, stabilize, control descent. VCs safety, technique, hand/LE placement  Ambulation/Gait Ambulation/Gait assistance: Min assist Ambulation Distance (Feet): 25 Feet Assistive device: Rolling walker (2 wheeled) Gait Pattern/deviations: Step-to pattern;Antalgic     General Gait Details: Small amount of assist to stabilize. VCs safety, seqeunce. Distance limited by leaking IV site.   Stairs            Wheelchair Mobility    Modified Rankin (Stroke Patients Only)       Balance                                             Pertinent Vitals/Pain Pain Assessment: 0-10 Pain Score: 6  Pain Location: R knee Pain Descriptors / Indicators: Sore;Aching Pain Intervention(s): Monitored during session;Repositioned;Ice applied    Home Living Family/patient expects to be discharged to:: Private  residence Living Arrangements: Spouse/significant other   Type of Home: House Home Access: Stairs to enter Entrance Stairs-Rails: Doctor, general practice of Steps: 4 Home Layout: Two level;Bed/bath upstairs Home Equipment: Crutches      Prior Function Level of Independence: Independent               Hand Dominance        Extremity/Trunk Assessment   Upper Extremity Assessment: Overall WFL for tasks assessed           Lower Extremity Assessment: Generalized weakness      Cervical / Trunk Assessment: Normal  Communication   Communication: No difficulties  Cognition Arousal/Alertness: Awake/alert Behavior During Therapy: WFL for tasks assessed/performed Overall Cognitive Status: Within Functional Limits for tasks assessed                      General Comments      Exercises     Assessment/Plan    PT Assessment Patient needs continued PT services  PT Problem List Decreased strength;Decreased mobility;Decreased range of motion;Decreased activity tolerance;Pain;Decreased knowledge of use of DME          PT Treatment Interventions DME instruction;Gait training;Therapeutic activities;Therapeutic exercise;Functional mobility training;Stair training;Balance training;Patient/family education    PT Goals (Current goals can be found in the Care Plan section)  Acute Rehab PT Goals Patient Stated Goal: regain independence/PLOF PT Goal Formulation: With patient Time For  Goal Achievement: 09/09/16 Potential to Achieve Goals: Good    Frequency 7X/week   Barriers to discharge        Co-evaluation               End of Session Equipment Utilized During Treatment: Gait belt Activity Tolerance: Patient tolerated treatment well Patient left: in chair;with call bell/phone within reach           Time: [id number] PT Time Calculation (min) (ACUTE ONLY): 29 min   Charges:   PT Evaluation $PT Eval Low Complexity: 1 Procedure      PT G Codes:        Rebeca AlertJannie Darshana Curnutt, MPT Pager: [phone]

## 2016-09-02 NOTE — Transfer of Care (Signed)
Immediate Anesthesia Transfer of Care Note  Patient: Tracie Wagner  Procedure(s) Performed: Procedure(s): RIGHT TOTAL KNEE ARTHROPLASTY (Right)  Patient Location: PACU  Anesthesia Type:Spinal  Level of Consciousness:  sedated, patient cooperative and responds to stimulation  Airway & Oxygen Therapy:Patient Spontanous Breathing and Patient connected to face mask oxgen  Post-op Assessment:  Report given to PACU RN and Post -op Vital signs reviewed and stable  Post vital signs:  Reviewed and stable  Last Vitals:  Vitals:   09/02/16 0540  BP: 125/62  Pulse: 84  Resp: 16  Temp: 36.7 C    Complications: No apparent anesthesia complications

## 2016-09-03 LAB — GLUCOSE, CAPILLARY
GLUCOSE-CAPILLARY: 213 mg/dL — AB (ref 65–99)
Glucose-Capillary: 151 mg/dL — ABNORMAL HIGH (ref 65–99)

## 2016-09-03 LAB — CBC
HEMATOCRIT: 35.1 % — AB (ref 36.0–46.0)
HEMOGLOBIN: 12.2 g/dL (ref 12.0–15.0)
MCH: 31 pg (ref 26.0–34.0)
MCHC: 34.8 g/dL (ref 30.0–36.0)
MCV: 89.3 fL (ref 78.0–100.0)
Platelets: 197 10*3/uL (ref 150–400)
RBC: 3.93 MIL/uL (ref 3.87–5.11)
RDW: 12.4 % (ref 11.5–15.5)
WBC: 12.2 10*3/uL — ABNORMAL HIGH (ref 4.0–10.5)

## 2016-09-03 LAB — BASIC METABOLIC PANEL
Anion gap: 7 (ref 5–15)
BUN: 12 mg/dL (ref 6–20)
CHLORIDE: 108 mmol/L (ref 101–111)
CO2: 22 mmol/L (ref 22–32)
Calcium: 8.4 mg/dL — ABNORMAL LOW (ref 8.9–10.3)
Creatinine, Ser: 0.88 mg/dL (ref 0.44–1.00)
GFR calc Af Amer: >60 mL/min (ref >60)
GFR calc non Af Amer: >60 mL/min (ref >60)
Glucose, Bld: 152 mg/dL — ABNORMAL HIGH (ref 65–99)
Potassium: 4.5 mmol/L (ref 3.5–5.1)
Sodium: 137 mmol/L (ref 135–145)

## 2016-09-03 MED ORDER — FERROUS SULFATE 325 (65 FE) MG PO TABS: 325.0000 mg | ORAL_TABLET | Freq: Three times a day (TID) | ORAL | 3 refills | Status: AC

## 2016-09-03 MED ORDER — DOCUSATE SODIUM 100 MG PO CAPS: 100.0000 mg | ORAL_CAPSULE | Freq: Two times a day (BID) | ORAL | 0 refills | Status: DC

## 2016-09-03 MED ORDER — POLYETHYLENE GLYCOL 3350 17 G PO PACK: 17.0000 g | PACK | Freq: Two times a day (BID) | ORAL | 0 refills | Status: DC

## 2016-09-03 NOTE — Evaluation (Signed)
   Occupational Therapy Evaluation Patient Details Name: Tracie LandauMartha M Wagner MRN: [id number] DOB: 1956/02/28 Today's Date: 09/03/2016    History of Present Illness 60 yo female s/p R TKA 09/01/16   Clinical Impression   Pt overall mod I with simple ADL activity. Family will A as needed    Follow Up Recommendations  No OT follow up    Equipment Recommendations  None recommended by OT    Recommendations for Other Services       Precautions / Restrictions Precautions Precautions: Knee;Fall Restrictions Weight Bearing Restrictions: No RLE Weight Bearing: Weight bearing as tolerated      Mobility Bed Mobility Overal bed mobility: Needs Assistance Bed Mobility: Supine to Sit     Supine to sit: Min guard;HOB elevated     General bed mobility comments: ptin chair  Transfers Overall transfer level: Modified independent Equipment used: Rolling walker (2 wheeled) Transfers: Sit to/from Stand Sit to Stand: Min guard         General transfer comment: close guard for safety. VCs safety, technique, hand/LE placement         ADL Overall ADL's : Modified independent                                       General ADL Comments: Pt overall mod I with ADL activity with education from OT with dressing, toileting and walker safety.  Pt will put a small bag on walker to carry items for fall prevention.                 Pertinent Vitals/Pain Pain Assessment: 0-10 Pain Score: 5  Pain Location: 4 Pain Descriptors / Indicators: Sore Pain Intervention(s): Monitored during session;Repositioned;Ice applied        Extremity/Trunk Assessment         Cervical / Trunk Assessment Cervical / Trunk Assessment: Normal   Communication Communication Communication: No difficulties   Cognition Arousal/Alertness: Awake/alert Behavior During Therapy: WFL for tasks assessed/performed Overall Cognitive Status: Within Functional Limits for tasks assessed                                 Home Living Family/patient expects to be discharged to:: Private residence Living Arrangements: Spouse/significant other   Type of Home: House Home Access: Stairs to enter Secretary/administratorntrance Stairs-Number of Steps: 4 Entrance Stairs-Rails: Right;Left Home Layout: Two level;Bed/bath upstairs Alternate Level Stairs-Number of Steps: 1 flight             Home Equipment: Crutches          Prior Functioning/Environment Level of Independence: Independent                       OT Goals(Current goals can be found in the care plan section) Acute Rehab OT Goals Patient Stated Goal: regain independence/PLOF  OT Frequency:                End of Session    Activity Tolerance: Patient tolerated treatment well Patient left: in chair;with call bell/phone within reach   Time: 0940-1000 OT Time Calculation (min): 20 min Charges:  OT General Charges $OT Visit: 1 Procedure OT Evaluation $OT Eval Low Complexity: 1 Procedure G-Codes:    Tracie CrowEDDING, Tracie Wagner 09/03/2016, 10:18 AM

## 2016-09-03 NOTE — Progress Notes (Signed)
Physical Therapy Treatment Patient Details Name: Tracie LandauMartha M Bolick MRN: [id number] DOB: [birth year] Today's Date: 09/03/2016    History of Present Illness 60 yo female s/p R TKA 09/01/16    PT Comments    Progressing well with mobility. Pain rated 5/10 with activity. Will plan to have a 2nd session to practice stair negotiation prior to d/c later today.   Follow Up Recommendations  Home health PT     Equipment Recommendations  Rolling walker with 5" wheels    Recommendations for Other Services       Precautions / Restrictions Precautions Precautions: Knee;Fall Restrictions Weight Bearing Restrictions: No RLE Weight Bearing: Weight bearing as tolerated    Mobility  Bed Mobility Overal bed mobility: Needs Assistance Bed Mobility: Supine to Sit     Supine to sit: Min guard;HOB elevated     General bed mobility comments: Pt used UEs to aid R LE.   Transfers Overall transfer level: Needs assistance Equipment used: Rolling walker (2 wheeled) Transfers: Sit to/from Stand Sit to Stand: Min guard         General transfer comment: close guard for safety. VCs safety, technique, hand/LE placement  Ambulation/Gait Ambulation/Gait assistance: Min guard Ambulation Distance (Feet): 100 Feet Assistive device: Rolling walker (2 wheeled) Gait Pattern/deviations: Antalgic;Step-to pattern     General Gait Details: close guard for safety.    Stairs            Wheelchair Mobility    Modified Rankin (Stroke Patients Only)       Balance                                    Cognition Arousal/Alertness: Awake/alert Behavior During Therapy: WFL for tasks assessed/performed Overall Cognitive Status: Within Functional Limits for tasks assessed                      Exercises Total Joint Exercises Ankle Circles/Pumps: AROM;Both;10 reps;Supine Quad Sets: AROM;Both;10 reps;Supine Hip ABduction/ADduction: AROM;Right;10  reps;AAROM;Supine Straight Leg Raises: AROM;Right;10 reps;Supine Knee Flexion: AROM;10 reps;Seated;Right Goniometric ROM: ~5-85 degrees    General Comments        Pertinent Vitals/Pain Pain Assessment: 0-10 Pain Score: 5  Pain Location: R knee Pain Descriptors / Indicators: Aching;Sore Pain Intervention(s): Repositioned;Ice applied;Monitored during session    Home Living                      Prior Function            PT Goals (current goals can now be found in the care plan section) Progress towards PT goals: Progressing toward goals    Frequency    7X/week      PT Plan Current plan remains appropriate    Co-evaluation             End of Session Equipment Utilized During Treatment: Gait belt Activity Tolerance: Patient tolerated treatment well Patient left: in chair;with call bell/phone within reach     Time: 0845-0909 PT Time Calculation (min) (ACUTE ONLY): 24 min  Charges:  $Gait Training: 8-22 mins $Therapeutic Exercise: 8-22 mins                    G Codes:      Rebeca AlertJannie Benedicto Capozzi, MPT Pager: [phone]

## 2016-09-03 NOTE — Care Management Note (Signed)
Case Management Note  Patient Details  Name: Tracie Wagner MRN: [id number] Date of Birth: [birth year]  Subjective/Objective:                  RIGHT TOTAL KNEE ARTHROPLASTY (Right) Action/Plan: Discharge planning Expected Discharge Date:  09/04/16               Expected Discharge Plan:  [place]  In-House Referral:     Discharge planning Services  CM Consult  Post Acute Care Choice:  Home Health Choice offered to:  Patient  DME Arranged:  3-N-1, Walker rolling DME Agency:  [place]:  PT [place] Agency:  [facility])  Status of Service:  Completed, signed off  If discussed at [employer], dates discussed:    Additional Comments: CM met with pt in room to offer choice of home health agency.  Pt chooses [facility] to render HHPT.  Referral called to [facility] rep, Tim.  CM notified Jermaine of [facility] to please deliver the rolling walker and the 3n1 to room prior to discharge.  No other CM needs were communicated. Dellie Catholic, RN 09/03/2016, 1:47 PM

## 2016-09-03 NOTE — Progress Notes (Signed)
Patient verbalized understanding of discharge instructions. Patient is stable at discharge. 

## 2016-09-03 NOTE — Progress Notes (Signed)
Physical Therapy Treatment Patient Details Name: Tracie Wagner MRN: [id number] DOB: December 08, 1955 Today's Date: 09/03/2016    History of Present Illness 60 yo female s/p R TKA 09/01/16    PT Comments    2nd session to practice stair negotiation. All education completed. Ready to d/c from PT standpoint-made RN aware.   Follow Up Recommendations  Home health PT     Equipment Recommendations  Rolling walker with 5" wheels    Recommendations for Other Services       Precautions / Restrictions Precautions Precautions: Fall;Knee Restrictions Weight Bearing Restrictions: No RLE Weight Bearing: Weight bearing as tolerated    Mobility  Bed Mobility               General bed mobility comments: oob in recliner  Transfers Overall transfer level: Needs assistance Equipment used: Rolling walker (2 wheeled) Transfers: Sit to/from Stand Sit to Stand: Min guard         General transfer comment: close guard for safety. VCs safety, technique, hand/LE placement  Ambulation/Gait Ambulation/Gait assistance: Min guard Ambulation Distance (Feet): 100 Feet Assistive device: Rolling walker (2 wheeled) Gait Pattern/deviations: Step-to pattern;Decreased stride length;Step-through pattern     General Gait Details: close guard for safety.    Stairs Stairs: Yes Stairs assistance: Min assist Stair Management: One rail Right;One rail Left;Step to pattern;Forwards;With crutches Number of Stairs: 8 General stair comments: VCs safety, technique, sequence. Small amount of assist to stabilize.   Wheelchair Mobility    Modified Rankin (Stroke Patients Only)       Balance                                    Cognition Arousal/Alertness: Awake/alert Behavior During Therapy: WFL for tasks assessed/performed Overall Cognitive Status: Within Functional Limits for tasks assessed                      Exercises      General Comments        Pertinent  Vitals/Pain Pain Assessment: 0-10 Pain Score: 6  Pain Location: R knee Pain Descriptors / Indicators: Sore Pain Intervention(s): Monitored during session    Home Living Family/patient expects to be discharged to:: Private residence Living Arrangements: Spouse/significant other   Type of Home: House Home Access: Stairs to enter Entrance Stairs-Rails: Right;Left Home Layout: Two level;Bed/bath upstairs Home Equipment: Crutches      Prior Function Level of Independence: Independent          PT Goals (current goals can now be found in the care plan section) Acute Rehab PT Goals Patient Stated Goal: regain independence/PLOF Progress towards PT goals: Progressing toward goals    Frequency    7X/week      PT Plan Current plan remains appropriate    Co-evaluation             End of Session Equipment Utilized During Treatment: Gait belt Activity Tolerance: Patient tolerated treatment well Patient left: in chair;with call bell/phone within reach;with family/visitor present     Time: [id number] PT Time Calculation (min) (ACUTE ONLY): 18 min  Charges:  $Gait Training: 8-22 mins                    G Codes:      Tracie Wagner, MPT Pager: [phone]

## 2016-09-03 NOTE — Progress Notes (Signed)
     Subjective: 1 Day Post-Op Procedure(s) (LRB): RIGHT TOTAL KNEE ARTHROPLASTY (Right)   Patient reports pain as mild, pain controlled. No events throughout the night. Feels that she already worked well with PT.  Ready to be discharged home.   Objective:   VITALS:   Vitals:   09/03/16 0225 09/03/16 0605  BP: (!) 100/49 97/69  Pulse: 67 65  Resp: 16 16  Temp: 97.6 F (36.4 C) 97.5 F (36.4 C)    Dorsiflexion/Plantar flexion intact Incision: dressing C/D/I No cellulitis present Compartment soft  LABS  Recent Labs  09/03/16 0427  HGB 12.2  HCT 35.1*  WBC 12.2*  PLT 197     Recent Labs  09/03/16 0427  NA 137  K 4.5  BUN 12  CREATININE 0.88  GLUCOSE 152*     Assessment/Plan: 1 Day Post-Op Procedure(s) (LRB): RIGHT TOTAL KNEE ARTHROPLASTY (Right) Foley cath d/c'ed Advance diet Up with therapy D/C IV fluids Discharge home with home health  [facility]. Follow up with OLIN,Graves Nipp D in 2 weeks.  Contact information:  [facility] [address], [address] [place] [id number] [phone]    Obese (BMI 30-39.9) Estimated body mass index is 31.23 kg/m as calculated from the following:   Height as of this encounter: 5' 1.75" (1.568 m).   Weight as of this encounter: 76.8 kg (169 lb 6 oz). Patient also counseled that weight may inhibit the healing process Patient counseled that losing weight will help with future health issues      Anastasio AuerbachMatthew S. Zakariye Nee   PAC  09/03/2016, 8:14 AM

## 2016-09-04 NOTE — Discharge Summary (Signed)
Physician Discharge Summary  Patient ID: Nadeen LandauMartha M Belt MRN: [id number] DOB/AGE: 1955/12/29 60 y.o.  Admit date: 09/02/2016 Discharge date: 09/03/2016   Procedures:  Procedure(s) (LRB): RIGHT TOTAL KNEE ARTHROPLASTY (Right)  Attending Physician:  Dr. Durene RomansMatthew Olin   Admission Diagnoses:   Right knee primary OA / pain  Discharge Diagnoses:  Principal Problem:   S/P right TKA Active Problems:   S/P knee replacement  Past Medical History:  Diagnosis Date  . Abnormal liver function   . Diabetes mellitus without complication (HCC)   . Family history of adverse reaction to anesthesia    son has ponv  . Fatty liver 2015  . Hypercholesteremia   . Hypertension   . Osteoarthritis    oa  . Pneumonia as child  . Restless leg syndrome     HPI:    Nadeen LandauMartha M Cavan, 60 y.o. female, has a history of pain and functional disability in the right knee due to arthritis and has failed non-surgical conservative treatments for greater than 12 weeks to include NSAID's and/or analgesics, corticosteriod injections and activity modification.  Onset of symptoms was gradual, starting ~5 years ago with gradually worsening course since that time. The patient noted no past surgery on the right knee(s).  Patient currently rates pain in the right knee(s) at 8 out of 10 with activity. Patient has night pain, worsening of pain with activity and weight bearing, pain that interferes with activities of daily living and pain with passive range of motion.  Patient has evidence of periarticular osteophytes and joint space narrowing by imaging studies. There is no active infection.   Risks, benefits and expectations were discussed with the patient.  Risks including but not limited to the risk of anesthesia, blood clots, nerve damage, blood vessel damage, failure of the prosthesis, infection and up to and including death.  Patient understand the risks, benefits and expectations and wishes to proceed with  surgery.  PCP: Astrid DivineGRIFFIN,ELAINE COLLINS, MD   Discharged Condition: good  Hospital Course:  Patient underwent the above stated procedure on 09/02/2016. Patient tolerated the procedure well and brought to the recovery room in good condition and subsequently to the floor.  POD #1 BP: 97/69 ; Pulse: 65 ; Temp: 97.5 F (36.4 C) ; Resp: 16 Patient reports pain as mild, pain controlled. No events throughout the night. Feels that she already worked well with PT.  Ready to be discharged home. Dorsiflexion/plantar flexion intact, incision: dressing C/D/I, no cellulitis present and compartment soft.   LABS  Basename    HGB     12.2  HCT     35.1    Discharge Exam: General appearance: alert, cooperative and no distress Extremities: Homans sign is negative, no sign of DVT, no edema, redness or tenderness in the calves or thighs and no ulcers, gangrene or trophic changes  Disposition: Home with follow up in 2 weeks   Follow-up Information    Shelda PalLIN,Jaziel Bennett D, MD. Schedule an appointment as soon as possible for a visit in 2 week(s).   Specialty:  Orthopedic Surgery Contact information: [address] [place] [address] [phone]        [facility] .   Specialty:  Home Health Services Why:  home health physical therapy Contact information: [address] [place] [address] [place] [address] [phone]        [facility] .   Why:  rolling walker and 3n1 Contact information: [address] [place]  [place] [address] [phone]           Discharge Instructions    Call MD / Call 911    Complete by:  As directed    If you experience chest pain or shortness of breath, CALL 911 and be transported to the hospital emergency room.  If you develope a fever above 101 F, pus (white drainage) or increased drainage or redness at the wound, or calf pain, call your surgeon's office.   Change dressing    Complete by:  As directed    Maintain  surgical dressing until follow up in the clinic. If the edges start to pull up, may reinforce with tape. If the dressing is no longer working, may remove and cover with gauze and tape, but must keep the area dry and clean.  Call with any questions or concerns.   Constipation Prevention    Complete by:  As directed    Drink plenty of fluids.  Prune juice may be helpful.  You may use a stool softener, such as Colace (over the counter) 100 mg twice a day.  Use MiraLax (over the counter) for constipation as needed.   Diet - low sodium heart healthy    Complete by:  As directed    Discharge instructions    Complete by:  As directed    Maintain surgical dressing until follow up in the clinic. If the edges start to pull up, may reinforce with tape. If the dressing is no longer working, may remove and cover with gauze and tape, but must keep the area dry and clean.  Follow up in 2 weeks at [facility]. Call with any questions or concerns.   Increase activity slowly as tolerated    Complete by:  As directed    Weight bearing as tolerated with assist device (walker, cane, etc) as directed, use it as long as suggested by your surgeon or therapist, typically at least 4-6 weeks.   TED hose    Complete by:  As directed    Use stockings (TED hose) for 2 weeks on both leg(s).  You may remove them at night for sleeping.        Medication List    STOP taking these medications   aspirin 81 MG tablet Replaced by:  aspirin 81 MG chewable tablet     TAKE these medications   ALPRAZolam 0.5 MG tablet Commonly known as:  XANAX Take 0.5 mg by mouth 2 (two) times daily as needed for anxiety.   amLODipine 5 MG tablet Commonly known as:  NORVASC Take 5 mg by mouth daily.   aspirin 81 MG chewable tablet Chew 1 tablet (81 mg total) by mouth 2 (two) times daily. Take for 4 weeks, then resume regular dose. Replaces:  aspirin 81 MG tablet   Calcium 1500 MG tablet Take 1,500 mg by mouth daily.    cholecalciferol 1000 units tablet Commonly known as:  VITAMIN D Take 1,000 Units by mouth daily.   docusate sodium 100 MG capsule Commonly known as:  COLACE Take 1 capsule (100 mg total) by mouth 2 (two) times daily.   ferrous sulfate 325 (65 FE) MG tablet Take 1 tablet (325 mg total) by mouth 3 (three) times daily with meals.   fish oil-omega-3 fatty acids 1000 MG capsule Take 1 capsule by mouth 2 (two) times daily.   folic acid 800 MCG tablet Commonly known as:  FOLVITE Take 800 mcg by mouth daily.   HYDROcodone-acetaminophen 7.5-325 MG tablet  Commonly known as:  NORCO Take 1-2 tablets by mouth every 4 (four) hours as needed for moderate pain.   metFORMIN 500 MG tablet Commonly known as:  GLUCOPHAGE Take 500 mg by mouth 2 (two) times daily.   methocarbamol 500 MG tablet Commonly known as:  ROBAXIN Take 1 tablet (500 mg total) by mouth every 6 (six) hours as needed for muscle spasms.   multivitamin capsule Take 1 capsule by mouth daily.   polyethylene glycol packet Commonly known as:  MIRALAX / GLYCOLAX Take 17 g by mouth 2 (two) times daily.   rOPINIRole 0.5 MG tablet Commonly known as:  REQUIP TAKE 2 TABLETS (1 MG TOTAL) BY MOUTH AT BEDTIME.   WELCHOL 625 MG tablet Generic drug:  colesevelam Take 2 tablets by mouth Twice daily.        Signed: Anastasio Auerbach. Wess Baney   PA-C  09/04/2016, 1:34 PM

## 2016-12-24 ENCOUNTER — Other Ambulatory Visit: Payer: Self-pay | Admitting: Obstetrics & Gynecology

## 2016-12-24 DIAGNOSIS — Z1231 Encounter for screening mammogram for malignant neoplasm of breast: Secondary | ICD-10-CM

## 2016-12-30 ENCOUNTER — Other Ambulatory Visit: Payer: Self-pay | Admitting: Adult Health

## 2017-01-06 ENCOUNTER — Ambulatory Visit
Admission: RE | Admit: 2017-01-06 | Discharge: 2017-01-06 | Disposition: A | Discharge status: Home / Self Care | Payer: PRIVATE HEALTH INSURANCE | Source: Ambulatory Visit | Attending: Obstetrics & Gynecology | Admitting: Obstetrics & Gynecology

## 2017-01-06 DIAGNOSIS — Z1231 Encounter for screening mammogram for malignant neoplasm of breast: Secondary | ICD-10-CM

## 2017-02-01 ENCOUNTER — Other Ambulatory Visit: Payer: Self-pay | Admitting: Adult Health

## 2017-02-04 ENCOUNTER — Other Ambulatory Visit (INDEPENDENT_AMBULATORY_CARE_PROVIDER_SITE_OTHER): Payer: PRIVATE HEALTH INSURANCE

## 2017-02-04 ENCOUNTER — Encounter: Payer: Self-pay | Admitting: Pulmonary Disease

## 2017-02-04 ENCOUNTER — Ambulatory Visit: Payer: PRIVATE HEALTH INSURANCE | Admitting: Pulmonary Disease

## 2017-02-04 ENCOUNTER — Ambulatory Visit (INDEPENDENT_AMBULATORY_CARE_PROVIDER_SITE_OTHER): Payer: PRIVATE HEALTH INSURANCE | Admitting: Pulmonary Disease

## 2017-02-04 VITALS — BP 112/72 | HR 91 | Ht 61.0 in | Wt 163.4 lb

## 2017-02-04 DIAGNOSIS — R5383 Other fatigue: Secondary | ICD-10-CM

## 2017-02-04 DIAGNOSIS — G2581 Restless legs syndrome: Secondary | ICD-10-CM

## 2017-02-04 LAB — FERRITIN: FERRITIN: 151.7 ng/mL (ref 10.0–291.0)

## 2017-02-04 NOTE — Assessment & Plan Note (Signed)
Patient is here for follow-up on restless leg syndrome.She had snoring, gasping, kicking all over for which she ended up having a lab study done in 2011. Her AHI was 1. Her PLMS was 120.  Her restless leg syndrome has been stable. She uses Requip, 0.5 mg, 1 tablet at dinnertime and 1 tablet at bedtime. By dinnertime, her leg started bothering her. She is happy with the current dose. No worsening of symptoms.  Denies hypersomnia, unrefreshed sleep. Occasional snoring.  Plan : 1. Will refill requip 0.5 mg 2 tablets in pm. Will give 1 years worth of meds. 2. Discussed with her to look for sx of OSA. May end up needing sleep study if more symptomatic.  3. Denies augmentation at this point.   4. Denies side effects such as sleepiness or pathologic gambling/spending.  5. Needs ferritin levels checked >> if abnormal or low, will need to tell pcp.

## 2017-02-04 NOTE — Progress Notes (Signed)
Subjective:    Patient ID: Tracie Wagner, female    DOB: 1956/07/04, 61 y.o.   MRN: [id number]  HPI Patient is being seen in the office for RLS.  ROV 02/04/2017 Patient returns to the office as follow-up on restless leg syndrome. She was seen a year ago and currently is on Requip, 0.5 mg, 2 tablets at bedtime. Her RLS has been stable. Controlled with Requip.  Has not been admitted nor has been on abx since last seen.  Some snoring, denies gasping/choking/witneesed apneas/fatigue.     Review of Systems  Constitutional: Negative.   HENT: Negative.   Eyes: Negative.   Respiratory: Negative.   Cardiovascular: Negative.   Gastrointestinal: Negative.   Endocrine: Negative.   Genitourinary: Negative.   Musculoskeletal: Negative.   Skin: Negative.   Allergic/Immunologic: Negative.   Neurological: Negative.   Hematological: Negative.   Psychiatric/Behavioral: Negative.   All other systems reviewed and are negative.  Past Medical History:  Diagnosis Date  . Abnormal liver function   . Diabetes mellitus without complication (HCC)   . Family history of adverse reaction to anesthesia    son has ponv  . Fatty liver 2015  . Hypercholesteremia   . Hypertension   . Osteoarthritis    oa  . Pneumonia as child  . Restless leg syndrome      Family History  Problem Relation Age of Onset  . Heart disease Mother   . Heart disease Sister   . Heart disease Maternal Grandfather      Past Surgical History:  Procedure Laterality Date  . CHOLECYSTECTOMY  11/1998  . ingrown toenail removed   1 month ago   left great toe   . left thumn cmp surgery  2015  . right great toenail removed  several yrs ago  . SHOULDER SURGERY Right 8 yrs ago   rotator cuff and acromoplasty  . surgery for atopic surgery  20 yrs ago  . TOTAL KNEE ARTHROPLASTY Right 09/02/2016   Procedure: RIGHT TOTAL KNEE ARTHROPLASTY;  Surgeon: Durene RomansMatthew Olin, MD;  Location: [facility];  Service: Orthopedics;  Laterality: Right;     Social History   Social History  . Marital status: Married    Spouse name: N/A  . Number of children: 2  . Years of education: N/A   Occupational History  . xray/ct tech [employer]   Social History Main Topics  . Smoking status: Never Smoker  . Smokeless tobacco: Never Used  . Alcohol use Yes     Comment: once a week  . Drug use: No  . Sexual activity: Not on file   Other Topics Concern  . Not on file   Social History Narrative  . No narrative on file     Allergies  Allergen Reactions  . Ace Inhibitors     REACTION: cough  . Crestor [Rosuvastatin Calcium]     hives  . Demerol [Meperidine]     rash  . Meperidine Hcl     REACTION: rash  . Septra [Sulfamethoxazole-Trimethoprim]     rash     Outpatient Medications Prior to Visit  Medication Sig Dispense Refill  . ALPRAZolam (XANAX) 0.5 MG tablet Take 0.5 mg by mouth 2 (two) times daily as needed for anxiety.   5  . amLODipine (NORVASC) 5 MG tablet Take 5 mg by mouth daily.      . Calcium 1500 MG tablet Take 1,500 mg by mouth daily.      . cholecalciferol (VITAMIN  D) 1000 UNITS tablet Take 1,000 Units by mouth daily.      . fish oil-omega-3 fatty acids 1000 MG capsule Take 1 capsule by mouth 2 (two) times daily.      . folic acid (FOLVITE) 800 MCG tablet Take 800 mcg by mouth daily.      . metFORMIN (GLUCOPHAGE) 500 MG tablet Take 500 mg by mouth 2 (two) times daily.      . Multiple Vitamin (MULTIVITAMIN) capsule Take 1 capsule by mouth daily.      Lilian Kapur 625 MG tablet Take 2 tablets by mouth Twice daily.    Marland Kitchen rOPINIRole (REQUIP) 0.5 MG tablet TAKE 2 TABLETS (1 MG TOTAL) BY MOUTH AT BEDTIME. 60 tablet 0  . ferrous sulfate 325 (65 FE) MG tablet Take 1 tablet (325 mg total) by mouth 3 (three) times daily with meals. (Patient not taking: Reported on 02/04/2017) 42 tablet 3  . HYDROcodone-acetaminophen (NORCO) 7.5-325 MG tablet Take 1-2 tablets by mouth every 4 (four) hours as needed for moderate pain.  (Patient not taking: Reported on 02/04/2017) 60 tablet 0  . docusate sodium (COLACE) 100 MG capsule Take 1 capsule (100 mg total) by mouth 2 (two) times daily. (Patient not taking: Reported on 02/04/2017) 60 capsule 0  . methocarbamol (ROBAXIN) 500 MG tablet Take 1 tablet (500 mg total) by mouth every 6 (six) hours as needed for muscle spasms. (Patient not taking: Reported on 02/04/2017) 40 tablet 0  . polyethylene glycol (MIRALAX / GLYCOLAX) packet Take 17 g by mouth 2 (two) times daily. (Patient not taking: Reported on 02/04/2017) 40 packet 0   No facility-administered medications prior to visit.    No orders of the defined types were placed in this encounter.       Objective:   Physical Exam  Vitals:  Vitals:   02/04/17 1013  BP: 112/72  Pulse: 91  SpO2: 96%  Weight: 163 lb 6.4 oz (74.1 kg)  Height: 5\' 1"  (1.549 m)    Constitutional/General:  Pleasant, well-nourished, well-developed, not in any distress,  Comfortably seating.  Well kempt  Body mass index is 30.87 kg/m. Wt Readings from Last 3 Encounters:  02/04/17 163 lb 6.4 oz (74.1 kg)  09/02/16 169 lb 6 oz (76.8 kg)  08/23/16 169 lb 6.4 oz (76.8 kg)     HEENT: Pupils equal and reactive to light and accommodation. Anicteric sclerae. Normal nasal mucosa.   No oral  lesions,  mouth clear,  oropharynx clear, no postnasal drip. (-) Oral thrush. No dental caries.  Airway - Mallampati class III  Neck: No masses. Midline trachea. No JVD, (-) LAD. (-) bruits appreciated.  Respiratory/Chest: Grossly normal chest. (-) deformity. (-) Accessory muscle use.  Symmetric expansion. (-) Tenderness on palpation.  Resonant on percussion.  Diminished BS on both lower lung zones. (-) wheezing, crackles, rhonchi (-) egophony  Cardiovascular: Regular rate and  rhythm, heart sounds normal, no murmur or gallops, no peripheral edema  Gastrointestinal:  Normal bowel sounds. Soft, non-tender. No hepatosplenomegaly.  (-) masses.    Musculoskeletal:  Normal muscle tone. Normal gait.   Extremities: Grossly normal. (-) clubbing, cyanosis.  (-) edema  Skin: (-) rash,lesions seen.   Neurological/Psychiatric : alert, oriented to time, place, person. Normal mood and affect          Assessment & Plan:  Restless legs syndrome Patient is here for follow-up on restless leg syndrome.She had snoring, gasping, kicking all over for which she ended up having a lab study done  in 2011. Her AHI was 1. Her PLMS was 120.  Her restless leg syndrome has been stable. She uses Requip, 0.5 mg, 1 tablet at dinnertime and 1 tablet at bedtime. By dinnertime, her leg started bothering her. She is happy with the current dose. No worsening of symptoms.  Denies hypersomnia, unrefreshed sleep. Occasional snoring.  Plan : 1. Will refill requip 0.5 mg 2 tablets in pm. Will give 1 years worth of meds. 2. Discussed with her to look for sx of OSA. May end up needing sleep study if more symptomatic.  3. Denies augmentation at this point.   4. Denies side effects such as sleepiness or pathologic gambling/spending.  5. Needs ferritin levels checked >> if abnormal or low, will need to tell pcp.     Return to clinic in 1 year.   Pollie Meyer, MD 02/04/2017, 11:00 AM [facility] Pager [phone] After 3 pm or if no answer, call [phone]

## 2017-02-04 NOTE — Patient Instructions (Signed)
  It was a pleasure taking care of you today!  You are diagnosed with Restless leg syndrome. Continue Requip, 0.5 mg per tablet, 2 tablets at bedtime. We will do blood work today.    Return to clinic in 1 year with NP.

## 2017-04-07 DIAGNOSIS — N8110 Cystocele, unspecified: Secondary | ICD-10-CM | POA: Diagnosis not present

## 2017-08-26 DIAGNOSIS — E1121 Type 2 diabetes mellitus with diabetic nephropathy: Secondary | ICD-10-CM | POA: Diagnosis not present

## 2017-08-26 DIAGNOSIS — Z23 Encounter for immunization: Secondary | ICD-10-CM | POA: Diagnosis not present

## 2017-08-26 DIAGNOSIS — I129 Hypertensive chronic kidney disease with stage 1 through stage 4 chronic kidney disease, or unspecified chronic kidney disease: Secondary | ICD-10-CM | POA: Diagnosis not present

## 2017-08-26 DIAGNOSIS — N183 Chronic kidney disease, stage 3 (moderate): Secondary | ICD-10-CM | POA: Diagnosis not present

## 2017-11-10 DIAGNOSIS — Z471 Aftercare following joint replacement surgery: Secondary | ICD-10-CM | POA: Diagnosis not present

## 2017-11-10 DIAGNOSIS — Z96651 Presence of right artificial knee joint: Secondary | ICD-10-CM | POA: Diagnosis not present

## 2017-11-14 DIAGNOSIS — M25661 Stiffness of right knee, not elsewhere classified: Secondary | ICD-10-CM | POA: Diagnosis not present

## 2017-11-28 DIAGNOSIS — M25661 Stiffness of right knee, not elsewhere classified: Secondary | ICD-10-CM | POA: Diagnosis not present

## 2017-12-02 DIAGNOSIS — J9801 Acute bronchospasm: Secondary | ICD-10-CM | POA: Diagnosis not present

## 2017-12-02 DIAGNOSIS — J069 Acute upper respiratory infection, unspecified: Secondary | ICD-10-CM | POA: Diagnosis not present

## 2017-12-02 DIAGNOSIS — R05 Cough: Secondary | ICD-10-CM | POA: Diagnosis not present

## 2017-12-05 DIAGNOSIS — M25661 Stiffness of right knee, not elsewhere classified: Secondary | ICD-10-CM | POA: Diagnosis not present

## 2017-12-16 DIAGNOSIS — M25661 Stiffness of right knee, not elsewhere classified: Secondary | ICD-10-CM | POA: Diagnosis not present

## 2017-12-19 ENCOUNTER — Other Ambulatory Visit: Payer: Self-pay | Admitting: Obstetrics & Gynecology

## 2017-12-19 DIAGNOSIS — Z1231 Encounter for screening mammogram for malignant neoplasm of breast: Secondary | ICD-10-CM

## 2018-01-01 DIAGNOSIS — Z96651 Presence of right artificial knee joint: Secondary | ICD-10-CM | POA: Diagnosis not present

## 2018-01-08 ENCOUNTER — Ambulatory Visit
Admission: RE | Admit: 2018-01-08 | Discharge: 2018-01-08 | Disposition: A | Discharge status: Home / Self Care | Payer: PRIVATE HEALTH INSURANCE | Source: Ambulatory Visit | Attending: Obstetrics & Gynecology | Admitting: Obstetrics & Gynecology

## 2018-01-08 DIAGNOSIS — Z1231 Encounter for screening mammogram for malignant neoplasm of breast: Secondary | ICD-10-CM

## 2018-02-04 ENCOUNTER — Ambulatory Visit: Payer: PRIVATE HEALTH INSURANCE | Admitting: Adult Health

## 2018-02-06 DIAGNOSIS — L404 Guttate psoriasis: Secondary | ICD-10-CM | POA: Diagnosis not present

## 2018-02-23 ENCOUNTER — Encounter: Payer: Self-pay | Admitting: Adult Health

## 2018-02-23 ENCOUNTER — Ambulatory Visit (INDEPENDENT_AMBULATORY_CARE_PROVIDER_SITE_OTHER): Payer: PRIVATE HEALTH INSURANCE | Admitting: Adult Health

## 2018-02-23 DIAGNOSIS — G2581 Restless legs syndrome: Secondary | ICD-10-CM

## 2018-02-23 MED ORDER — ROPINIROLE HCL 0.5 MG PO TABS: 1.0000 mg | ORAL_TABLET | Freq: Every day | ORAL | 11 refills | Status: DC

## 2018-02-23 NOTE — Addendum Note (Signed)
Addended by: Boone MasterJONES, Nashaly Dorantes E on: 02/23/2018 04:30 PM   Modules accepted: Orders

## 2018-02-23 NOTE — Patient Instructions (Addendum)
Continue on current regimen Follow-up in 1 year with Dr. Craige CottaSood  and as needed .

## 2018-02-23 NOTE — Assessment & Plan Note (Signed)
Well controlled on requip   Plan  Patient Instructions  Continue on current regimen Follow-up in 1 year with Dr. Craige CottaSood  and as needed .

## 2018-02-23 NOTE — Progress Notes (Signed)
@Patient  ID: Tracie Wagner, female    DOB: 1956/03/06, 62 y.o.   MRN: [id number]  No chief complaint on file.   Referring provider: Maurice Small, MD  HPI: 62 yo female followed for RLS   02/23/2018 Follow up : RLS Patient presents for a one year follow-up.  She has underlying restless leg.  She is on Requip 0.5 mg 2 tablets at bedtime.  She has been doing well.  She denies any increase restless leg symptoms.. Says she feels rested.   Allergies  Allergen Reactions  . Ace Inhibitors     REACTION: cough  . Crestor [Rosuvastatin Calcium]     hives  . Demerol [Meperidine]     rash  . Meperidine Hcl     REACTION: rash  . Septra [Sulfamethoxazole-Trimethoprim]     rash    Immunization History  Administered Date(s) Administered  . Influenza Split 08/25/2017  . Influenza Whole 07/27/2011  . Influenza,inj,Quad PF,6+ Mos 08/25/2013, 09/22/2015, 08/26/2016  . Influenza-Unspecified 08/25/2014  . Tdap 04/12/2013    Past Medical History:  Diagnosis Date  . Abnormal liver function   . Diabetes mellitus without complication (HCC)   . Family history of adverse reaction to anesthesia    son has ponv  . Fatty liver 2015  . Hypercholesteremia   . Hypertension   . Osteoarthritis    oa  . Pneumonia as child  . Restless leg syndrome     Tobacco History: Social History   Tobacco Use  Smoking Status Never Smoker  Smokeless Tobacco Never Used   Counseling given: Not Answered   Outpatient Encounter Medications as of 02/23/2018  Medication Sig  . ALPRAZolam (XANAX) 0.5 MG tablet Take 0.5 mg by mouth 2 (two) times daily as needed for anxiety.   Marland Kitchen amLODipine (NORVASC) 5 MG tablet Take 5 mg by mouth daily.    . Calcium 1500 MG tablet Take 1,500 mg by mouth daily.    . cholecalciferol (VITAMIN D) 1000 UNITS tablet Take 1,000 Units by mouth daily.    . ferrous sulfate 325 (65 FE) MG tablet Take 1 tablet (325 mg total) by mouth 3 (three) times daily with meals.  . fish  oil-omega-3 fatty acids 1000 MG capsule Take 1 capsule by mouth 2 (two) times daily.    . folic acid (FOLVITE) 800 MCG tablet Take 800 mcg by mouth daily.    Marland Kitchen HYDROcodone-acetaminophen (NORCO) 7.5-325 MG tablet Take 1-2 tablets by mouth every 4 (four) hours as needed for moderate pain.  . metFORMIN (GLUCOPHAGE) 500 MG tablet Take 1,000 mg by mouth 2 (two) times daily.   . Multiple Vitamin (MULTIVITAMIN) capsule Take 1 capsule by mouth daily.    Marland Kitchen rOPINIRole (REQUIP) 0.5 MG tablet TAKE 2 TABLETS AT BEDTIME  . WELCHOL 625 MG tablet Take 2 tablets by mouth Twice daily.   No facility-administered encounter medications on file as of 02/23/2018.      Review of Systems  Constitutional:   No  weight loss, night sweats,  Fevers, chills, fatigue, or  lassitude.  HEENT:   No headaches,  Difficulty swallowing,  Tooth/dental problems, or  Sore throat,                No sneezing, itching, ear ache, nasal congestion, post nasal drip,   CV:  No chest pain,  Orthopnea, PND, swelling in lower extremities, anasarca, dizziness, palpitations, syncope.   GI  No heartburn, indigestion, abdominal pain, nausea, vomiting, diarrhea, change in bowel habits, loss of  appetite, bloody stools.   Resp: No shortness of breath with exertion or at rest.  No excess mucus, no productive cough,  No non-productive cough,  No coughing up of blood.  No change in color of mucus.  No wheezing.  No chest wall deformity  Skin: no rash or lesions.  GU: no dysuria, change in color of urine, no urgency or frequency.  No flank pain, no hematuria   MS:  No joint pain or swelling.  No decreased range of motion.  No back pain.    Physical Exam  BP 132/74 (BP Location: Left Arm, Cuff Size: Normal)   Pulse 87   Ht 5' 1.75" (1.568 m)   Wt 165 lb 3.2 oz (74.9 kg)   SpO2 97%   BMI 30.46 kg/m   GEN: A/Ox3; pleasant , NAD, well nourished    HEENT:  /AT,  EACs-clear, TMs-wnl, NOSE-clear, THROAT-clear, no lesions, no postnasal drip  or exudate noted.   NECK:  Supple w/ fair ROM; no JVD; normal carotid impulses w/o bruits; no thyromegaly or nodules palpated; no lymphadenopathy.    RESP  Clear  P & A; w/o, wheezes/ rales/ or rhonchi. no accessory muscle use, no dullness to percussion  CARD:  RRR, no m/r/g, no peripheral edema, pulses intact, no cyanosis or clubbing.  GI:   Soft & nt; nml bowel sounds; no organomegaly or masses detected.   Musco: Warm bil, no deformities or joint swelling noted.   Neuro: alert, no focal deficits noted.    Skin: Warm, no lesions or rashes    Lab Results:  BNP No results found for: BNP  ProBNP No results found for: PROBNP  Imaging: No results found.   Assessment & Plan:   Restless legs syndrome Well controlled on requip   Plan  Patient Instructions  Continue on current regimen Follow-up in 1 year with Dr. Craige CottaSood  and as needed .         Rubye Oaksammy Gearl Kimbrough, NP 02/23/2018

## 2018-03-04 DIAGNOSIS — N8111 Cystocele, midline: Secondary | ICD-10-CM | POA: Diagnosis not present

## 2018-03-06 DIAGNOSIS — L565 Disseminated superficial actinic porokeratosis (DSAP): Secondary | ICD-10-CM | POA: Diagnosis not present

## 2018-04-01 DIAGNOSIS — N183 Chronic kidney disease, stage 3 (moderate): Secondary | ICD-10-CM | POA: Diagnosis not present

## 2018-04-01 DIAGNOSIS — Z0000 Encounter for general adult medical examination without abnormal findings: Secondary | ICD-10-CM | POA: Diagnosis not present

## 2018-04-01 DIAGNOSIS — I129 Hypertensive chronic kidney disease with stage 1 through stage 4 chronic kidney disease, or unspecified chronic kidney disease: Secondary | ICD-10-CM | POA: Diagnosis not present

## 2018-04-01 DIAGNOSIS — E1121 Type 2 diabetes mellitus with diabetic nephropathy: Secondary | ICD-10-CM | POA: Diagnosis not present

## 2018-04-16 DIAGNOSIS — N393 Stress incontinence (female) (male): Secondary | ICD-10-CM | POA: Diagnosis not present

## 2018-04-16 DIAGNOSIS — N812 Incomplete uterovaginal prolapse: Secondary | ICD-10-CM | POA: Diagnosis not present

## 2018-04-16 DIAGNOSIS — M6208 Separation of muscle (nontraumatic), other site: Secondary | ICD-10-CM | POA: Diagnosis not present

## 2018-04-16 DIAGNOSIS — N813 Complete uterovaginal prolapse: Secondary | ICD-10-CM | POA: Diagnosis not present

## 2018-04-16 DIAGNOSIS — I10 Essential (primary) hypertension: Secondary | ICD-10-CM | POA: Diagnosis not present

## 2019-02-03 ENCOUNTER — Other Ambulatory Visit: Payer: Self-pay | Admitting: Obstetrics & Gynecology

## 2019-02-03 DIAGNOSIS — Z1231 Encounter for screening mammogram for malignant neoplasm of breast: Secondary | ICD-10-CM

## 2019-03-13 ENCOUNTER — Other Ambulatory Visit: Payer: Self-pay | Admitting: Adult Health

## 2019-03-15 NOTE — Telephone Encounter (Signed)
Received refill request:  Medication name/strength/dose: Requip 0.5mg  (take 2 tabs at bedtime) Medication last rx'd: 02/2018 Quantity and number of refills last rx'd: #60 with 11 refills   Patient last seen in the office on 02/2018 by TP  Does refill need to be authorized by a provider? Yes, last filled by TP  Tammy, Pt does not have a pending appt. Please advise if ok to refill. Thanks.

## 2019-03-23 NOTE — Telephone Encounter (Signed)
Per TP: okay to refill x5

## 2019-03-31 ENCOUNTER — Ambulatory Visit (INDEPENDENT_AMBULATORY_CARE_PROVIDER_SITE_OTHER): Payer: PRIVATE HEALTH INSURANCE | Admitting: Primary Care

## 2019-03-31 ENCOUNTER — Other Ambulatory Visit: Payer: Self-pay

## 2019-03-31 ENCOUNTER — Encounter: Payer: Self-pay | Admitting: Primary Care

## 2019-03-31 DIAGNOSIS — R0683 Snoring: Secondary | ICD-10-CM

## 2019-03-31 DIAGNOSIS — G2581 Restless legs syndrome: Secondary | ICD-10-CM

## 2019-03-31 MED ORDER — ROPINIROLE HCL 0.5 MG PO TABS: 1.0000 mg | ORAL_TABLET | Freq: Every day | ORAL | 11 refills | Status: AC

## 2019-03-31 NOTE — Assessment & Plan Note (Signed)
-   Stable; symptoms well controlled with current medication - Continue Requip 0.5mg  2 tablets at bedtime (refills sent) - Reviewed RLS patient education material

## 2019-03-31 NOTE — Assessment & Plan Note (Signed)
-   Reports weight loss - Enc side lying position

## 2019-03-31 NOTE — Patient Instructions (Addendum)
Refilled Requip (year supply)  Please establish with Dr. Craige CottaSood or Dr. Wynona Neatlalere in 1 year    Restless Legs Syndrome Restless legs syndrome is a condition that causes uncomfortable feelings or sensations in the legs, especially while sitting or lying down. The sensations usually cause an overwhelming urge to move the legs. The arms can also sometimes be affected. The condition can range from mild to severe. The symptoms often interfere with a person's ability to sleep. What are the causes? The cause of this condition is not known. What increases the risk? The following factors may make you more likely to develop this condition:  Being older than 50.  Pregnancy.  Being a woman. In general, the condition is more common in women than in men.  A family history of the condition.  Having iron deficiency.  Overuse of caffeine, nicotine, or alcohol.  Certain medical conditions, such as kidney disease, Parkinson's disease, or nerve damage.  Certain medicines, such as those for high blood pressure, nausea, colds, allergies, depression, and some heart conditions. What are the signs or symptoms? The main symptom of this condition is uncomfortable sensations in the legs, such as:  Pulling.  Tingling.  Prickling.  Throbbing.  Crawling.  Burning. Usually, the sensations:  Affect both sides of the body.  Are worse when you sit or lie down.  Are worse at night. These may wake you up or make it difficult to fall asleep.  Make you have a strong urge to move your legs.  Are temporarily relieved by moving your legs. The arms can also be affected, but this is rare. People who have this condition often have tiredness during the day because of their lack of sleep at night. How is this diagnosed? This condition may be diagnosed based on:  Your symptoms.  Blood tests. In some cases, you may be monitored in a sleep lab by a specialist (a sleep study). This can detect any disruptions in  your sleep. How is this treated? This condition is treated by managing the symptoms. This may include:  Lifestyle changes, such as exercising, using relaxation techniques, and avoiding caffeine, alcohol, or tobacco.  Medicines. Anti-seizure medicines may be tried first. Follow these instructions at home:     General instructions  Take over-the-counter and prescription medicines only as told by your health care provider.  Use methods to help relieve the uncomfortable sensations, such as: ? Massaging your legs. ? Walking or stretching. ? Taking a cold or hot bath.  Keep all follow-up visits as told by your health care provider. This is important. Lifestyle  Practice good sleep habits. For example, go to bed and get up at the same time every day. Most adults should get 7-9 hours of sleep each night.  Exercise regularly. Try to get at least 30 minutes of exercise most days of the week.  Practice ways of relaxing, such as yoga or meditation.  Avoid caffeine and alcohol.  Do not use any products that contain nicotine or tobacco, such as cigarettes and e-cigarettes. If you need help quitting, ask your health care provider. Contact a health care provider if:  Your symptoms get worse or they do not improve with treatment. Summary  Restless legs syndrome is a condition that causes uncomfortable feelings or sensations in the legs, especially while sitting or lying down.  The symptoms often interfere with a person's ability to sleep.  This condition is treated by managing the symptoms. You may need to make lifestyle changes or take  medicines. This information is not intended to replace advice given to you by your health care provider. Make sure you discuss any questions you have with your health care provider. Document Released: 11/01/2002 Document Revised: 12/01/2017 Document Reviewed: 12/01/2017 Elsevier Interactive Patient Education  2019 [employer].

## 2019-03-31 NOTE — Progress Notes (Signed)
@Patient  ID: Tracie Wagner, female    DOB: 1956/03/29, 63 y.o.   MRN: [id number]  Chief Complaint  Patient presents with  . Follow-up    f/u restless leg-doing ok    Referring provider: Maurice Small, MD  HPI: 63 year old female, never smoked. PMH significant for hypertension, snoring, restless leg syndrome. Previous patient of Dr. Christene Slates, last seen by pulmonary NP on 02/23/18. Sleep study in 2011 showed AHI 2.5/hr. Her PLMS was 120. Maintained on Requip 0.5mg  2 tablets at bedtime.   03/31/2019 Patient presents today for annual visit. She is doing well, takes requip most night with improvement in RLS. States that she takes 1 tab at dinner time and then a second at bedtime. If she does not take medication her symptoms wake her up in the middle of the night. Symptoms are worse when she is on her feet all day at work. She reports losing some weight but still snoring. Feels like she gets a good night sleep and feels well rested in the morning when able to get 6-7 hours. No witnessed apnea.    Allergies  Allergen Reactions  . Ace Inhibitors     REACTION: cough  . Crestor [Rosuvastatin Calcium]     hives  . Demerol [Meperidine]     rash  . Meperidine Hcl     REACTION: rash  . Septra [Sulfamethoxazole-Trimethoprim]     rash    Immunization History  Administered Date(s) Administered  . Influenza Split 08/25/2017  . Influenza Whole 07/27/2011  . Influenza,inj,Quad PF,6+ Mos 08/25/2013, 09/22/2015, 08/26/2016  . Influenza-Unspecified 08/25/2014  . Tdap 04/12/2013    Past Medical History:  Diagnosis Date  . Abnormal liver function   . Diabetes mellitus without complication (HCC)   . Family history of adverse reaction to anesthesia    son has ponv  . Fatty liver 2015  . Hypercholesteremia   . Hypertension   . Osteoarthritis    oa  . Pneumonia as child  . Restless leg syndrome     Tobacco History: Social History   Tobacco Use  Smoking Status Never Smoker   Smokeless Tobacco Never Used   Counseling given: Not Answered   Outpatient Medications Prior to Visit  Medication Sig Dispense Refill  . ALPRAZolam (XANAX) 0.5 MG tablet Take 0.5 mg by mouth 2 (two) times daily as needed for anxiety.   5  . amLODipine (NORVASC) 5 MG tablet Take 5 mg by mouth daily.      . Calcium 1500 MG tablet Take 1,500 mg by mouth daily.      . cholecalciferol (VITAMIN D) 1000 UNITS tablet Take 1,000 Units by mouth daily.      . ferrous sulfate 325 (65 FE) MG tablet Take 1 tablet (325 mg total) by mouth 3 (three) times daily with meals. 42 tablet 3  . fish oil-omega-3 fatty acids 1000 MG capsule Take 1 capsule by mouth 2 (two) times daily.      . folic acid (FOLVITE) 800 MCG tablet Take 800 mcg by mouth daily.      Marland Kitchen HYDROcodone-acetaminophen (NORCO) 7.5-325 MG tablet Take 1-2 tablets by mouth every 4 (four) hours as needed for moderate pain. 60 tablet 0  . metFORMIN (GLUCOPHAGE) 500 MG tablet Take 1,000 mg by mouth 2 (two) times daily.     . Multiple Vitamin (MULTIVITAMIN) capsule Take 1 capsule by mouth daily.      Lilian Kapur 625 MG tablet Take 2 tablets by mouth Twice daily.    Marland Kitchen  rOPINIRole (REQUIP) 0.5 MG tablet TAKE 2 TABLETS (1 MG TOTAL) BY MOUTH AT BEDTIME. 60 tablet 5   No facility-administered medications prior to visit.     Review of Systems  Review of Systems  Constitutional: Negative.   HENT: Negative.   Respiratory: Negative.   Cardiovascular: Negative.   Skin: Negative.   Allergic/Immunologic: Negative.   Psychiatric/Behavioral: Positive for sleep disturbance.    Physical Exam  BP 124/64 (BP Location: Right Arm, Cuff Size: Normal)   Pulse 94   Temp 98.2 F (36.8 C)   Ht 5' 1.75" (1.568 m)   Wt 163 lb 3.2 oz (74 kg)   SpO2 95%   BMI 30.09 kg/m  Physical Exam Constitutional:      General: She is not in acute distress.    Appearance: Normal appearance. She is well-developed. She is not ill-appearing.  HENT:     Head: Normocephalic and  atraumatic.     Right Ear: Tympanic membrane normal.     Left Ear: Tympanic membrane normal.     Nose: Nose normal.     Mouth/Throat:     Mouth: Mucous membranes are moist.     Comments: Oral sore/ulcer to roof mouth  Eyes:     Pupils: Pupils are equal, round, and reactive to light.  Neck:     Musculoskeletal: Normal range of motion and neck supple.  Cardiovascular:     Rate and Rhythm: Normal rate and regular rhythm.     Heart sounds: Normal heart sounds. No murmur.  Pulmonary:     Effort: Pulmonary effort is normal. No respiratory distress.     Breath sounds: Normal breath sounds. No wheezing.  Musculoskeletal: Normal range of motion.  Skin:    General: Skin is warm and dry.     Findings: No erythema or rash.  Neurological:     Mental Status: She is alert and oriented to person, place, and time.  Psychiatric:        Mood and Affect: Mood normal.        Behavior: Behavior normal.        Thought Content: Thought content normal.        Judgment: Judgment normal.      Lab Results:  CBC    Component Value Date/Time   WBC 12.2 (H) 09/03/2016 0427   RBC 3.93 09/03/2016 0427   HGB 12.2 09/03/2016 0427   HCT 35.1 (L) 09/03/2016 0427   PLT 197 09/03/2016 0427   MCV 89.3 09/03/2016 0427   MCH 31.0 09/03/2016 0427   MCHC 34.8 09/03/2016 0427   RDW 12.4 09/03/2016 0427    BMET    Component Value Date/Time   NA 137 09/03/2016 0427   K 4.5 09/03/2016 0427   CL 108 09/03/2016 0427   CO2 22 09/03/2016 0427   GLUCOSE 152 (H) 09/03/2016 0427   BUN 12 09/03/2016 0427   CREATININE 0.88 09/03/2016 0427   CALCIUM 8.4 (L) 09/03/2016 0427   GFRNONAA >60 09/03/2016 0427   GFRAA >60 09/03/2016 0427    BNP No results found for: BNP  ProBNP No results found for: PROBNP  Imaging: No results found.   Assessment & Plan:   Snoring - Reports weight loss - Enc side lying position   Restless legs syndrome - Stable; symptoms well controlled with current medication -  Continue Requip 0.5mg  2 tablets at bedtime (refills sent) - Reviewed RLS patient education material   FU in 1 year with Dr. Craige CottaSood or Dr. Wynona Neatlalere  Glenford Bayley, NP 03/31/2019

## 2019-07-05 ENCOUNTER — Ambulatory Visit
Admission: RE | Admit: 2019-07-05 | Discharge: 2019-07-05 | Disposition: A | Discharge status: Home / Self Care | Payer: PRIVATE HEALTH INSURANCE | Source: Ambulatory Visit | Attending: Obstetrics & Gynecology | Admitting: Obstetrics & Gynecology

## 2019-07-05 ENCOUNTER — Other Ambulatory Visit: Payer: Self-pay

## 2019-07-05 DIAGNOSIS — Z1231 Encounter for screening mammogram for malignant neoplasm of breast: Secondary | ICD-10-CM

## 2019-07-06 NOTE — Progress Notes (Signed)
Reviewed and agree with assessment/plan.   Leyana Whidden, MD [facility] 11/20/2016, 12:24 PM Pager:  [phone]  

## 2019-10-28 ENCOUNTER — Other Ambulatory Visit: Payer: Self-pay | Admitting: Family Medicine

## 2019-10-28 DIAGNOSIS — R748 Abnormal levels of other serum enzymes: Secondary | ICD-10-CM

## 2019-11-05 ENCOUNTER — Ambulatory Visit
Admission: RE | Admit: 2019-11-05 | Discharge: 2019-11-05 | Disposition: A | Discharge status: Home / Self Care | Payer: PRIVATE HEALTH INSURANCE | Source: Ambulatory Visit | Attending: Family Medicine | Admitting: Family Medicine

## 2019-11-05 DIAGNOSIS — R748 Abnormal levels of other serum enzymes: Secondary | ICD-10-CM

## 2020-01-06 ENCOUNTER — Ambulatory Visit: Payer: PRIVATE HEALTH INSURANCE | Attending: Internal Medicine

## 2020-01-06 DIAGNOSIS — Z23 Encounter for immunization: Secondary | ICD-10-CM | POA: Insufficient documentation

## 2020-01-06 NOTE — Progress Notes (Signed)
   Covid-19 Vaccination Clinic  Name:  Tracie Wagner    MRN: [id number] DOB: 1956/06/26  01/06/2020  [name] was observed post Covid-19 immunization for 15 minutes without incidence. She was provided with Vaccine Information Sheet and instruction to access the V-Safe system.   [name] was instructed to call 911 with any severe reactions post vaccine: Marland Kitchen Difficulty breathing  . Swelling of your face and throat  . A fast heartbeat  . A bad rash all over your body  . Dizziness and weakness    Immunizations Administered    Name Date Dose VIS Date Route   Pfizer COVID-19 Vaccine 01/06/2020  4:47 PM 0.3 mL 11/05/2019 Intramuscular   Manufacturer: [employer], [employer]   Lot: [id number]   NDC: [address]-0

## 2020-01-29 ENCOUNTER — Ambulatory Visit: Payer: PRIVATE HEALTH INSURANCE | Attending: Internal Medicine

## 2020-01-29 DIAGNOSIS — Z23 Encounter for immunization: Secondary | ICD-10-CM | POA: Insufficient documentation

## 2020-01-29 NOTE — Progress Notes (Signed)
   Covid-19 Vaccination Clinic  Name:  Tracie Wagner    MRN: [id number] DOB: 1956/07/10  01/29/2020  [name] was observed post Covid-19 immunization for 15 minutes without incident. She was provided with Vaccine Information Sheet and instruction to access the V-Safe system.   [name] was instructed to call 911 with any severe reactions post vaccine: Marland Kitchen Difficulty breathing  . Swelling of face and throat  . A fast heartbeat  . A bad rash all over body  . Dizziness and weakness   Immunizations Administered    Name Date Dose VIS Date Route   Pfizer COVID-19 Vaccine 01/29/2020  1:51 PM 0.3 mL 11/05/2019 Intramuscular   Manufacturer: [employer], [employer]   Lot: [id number]   NDC: [address]-3

## 2020-04-09 ENCOUNTER — Other Ambulatory Visit: Payer: Self-pay | Admitting: Primary Care

## 2020-04-13 ENCOUNTER — Other Ambulatory Visit: Payer: Self-pay | Admitting: Primary Care

## 2020-04-14 ENCOUNTER — Other Ambulatory Visit: Payer: Self-pay | Admitting: *Deleted

## 2020-07-25 ENCOUNTER — Other Ambulatory Visit: Payer: Self-pay | Admitting: Family Medicine

## 2020-07-25 DIAGNOSIS — Z1231 Encounter for screening mammogram for malignant neoplasm of breast: Secondary | ICD-10-CM

## 2020-08-09 ENCOUNTER — Other Ambulatory Visit: Payer: Self-pay

## 2020-08-09 ENCOUNTER — Ambulatory Visit
Admission: RE | Admit: 2020-08-09 | Discharge: 2020-08-09 | Disposition: A | Discharge status: Home / Self Care | Payer: Managed Care, Other (non HMO) | Source: Ambulatory Visit | Attending: Family Medicine | Admitting: Family Medicine

## 2020-08-09 DIAGNOSIS — Z1231 Encounter for screening mammogram for malignant neoplasm of breast: Secondary | ICD-10-CM

## 2020-08-14 IMAGING — US US ABDOMEN LIMITED
1 series · 14 of 25 positions shown · non-contrast
Comparison: 10/06/2014

CLINICAL DATA: Elevated LFTs, cholecystectomy

EXAM:
ULTRASOUND ABDOMEN LIMITED RIGHT UPPER QUADRANT

[Series 1: us abdomen limited · 0.25mm/px · 34 acquisitions, 14 frames shown]
[im 1/34]
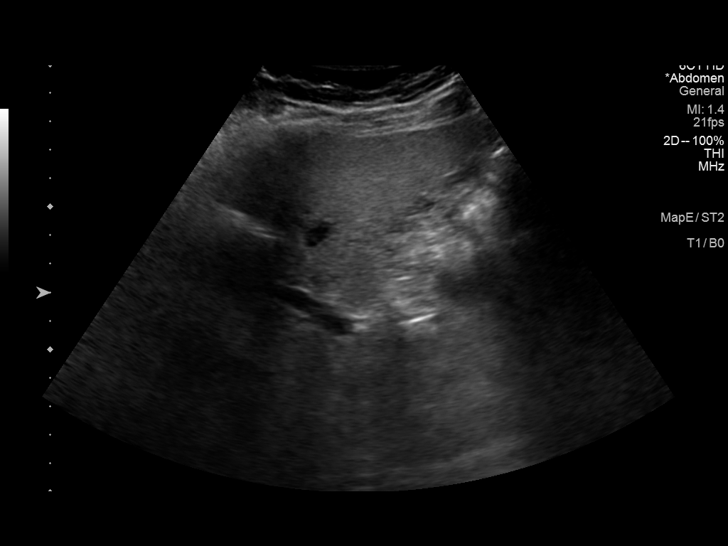
[im 3/34]
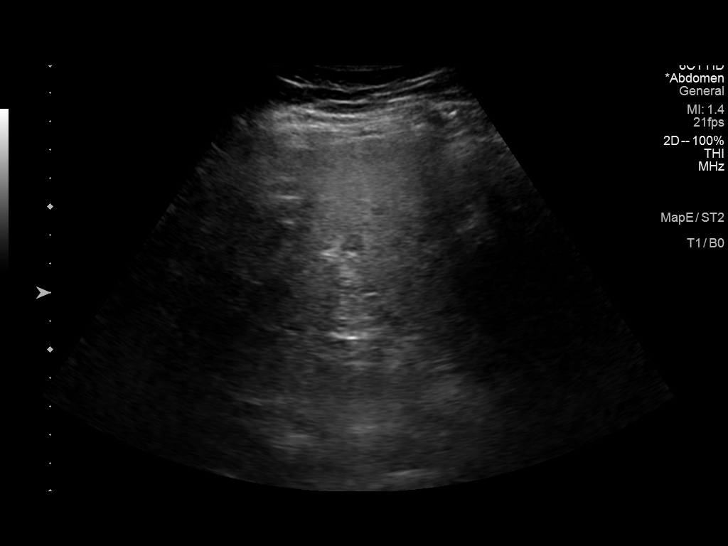
[im 6/34]
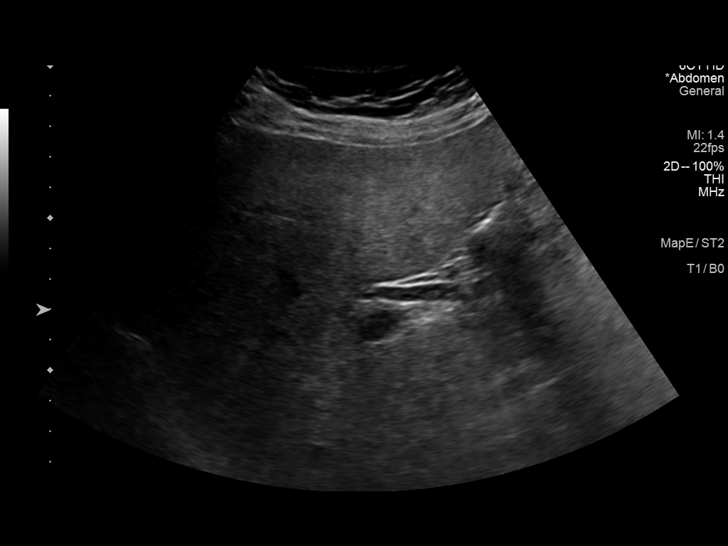
[im 9/34]
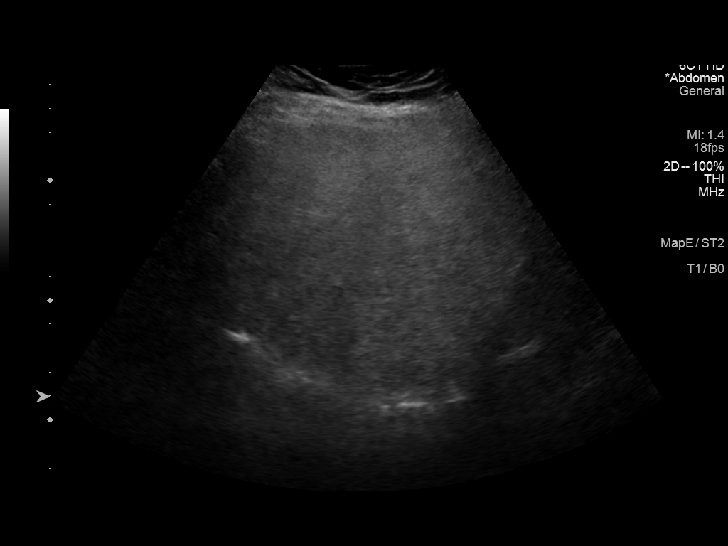
[im 12/34]
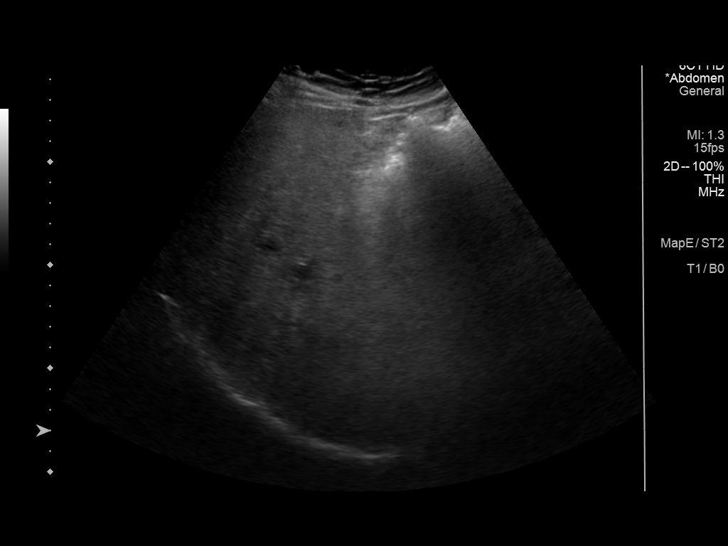
[im 13/34]
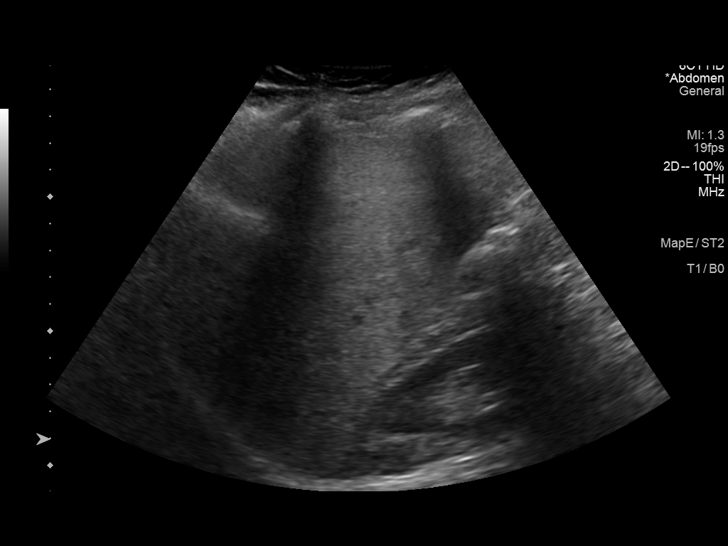
[im 16/34]
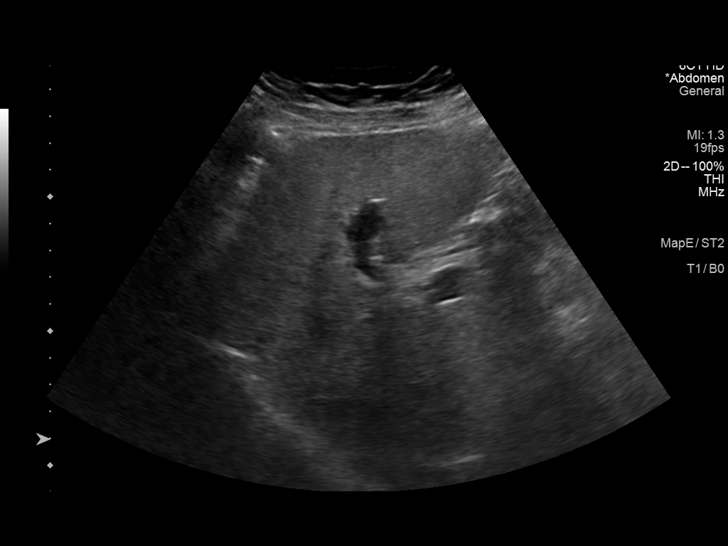
[im 18/34]
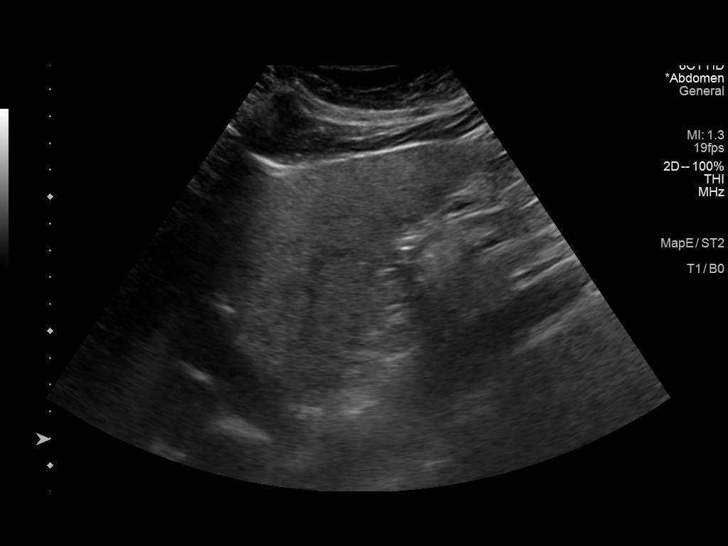
[im 21/34]
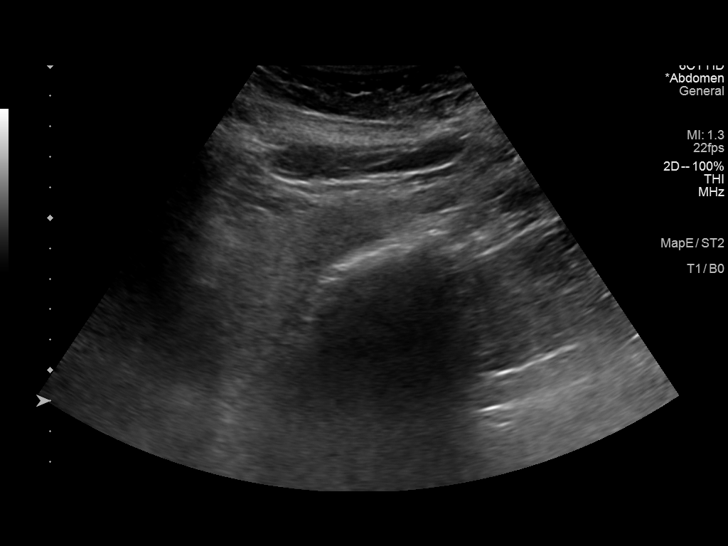
[im 23/34]
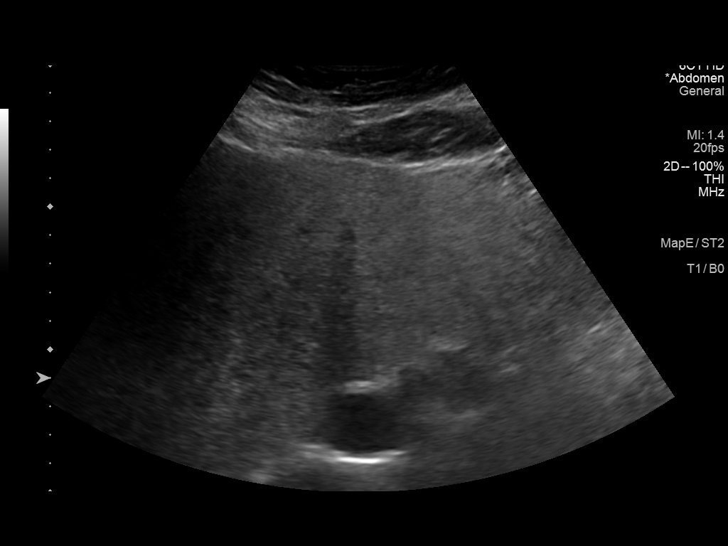
[im 25/34]
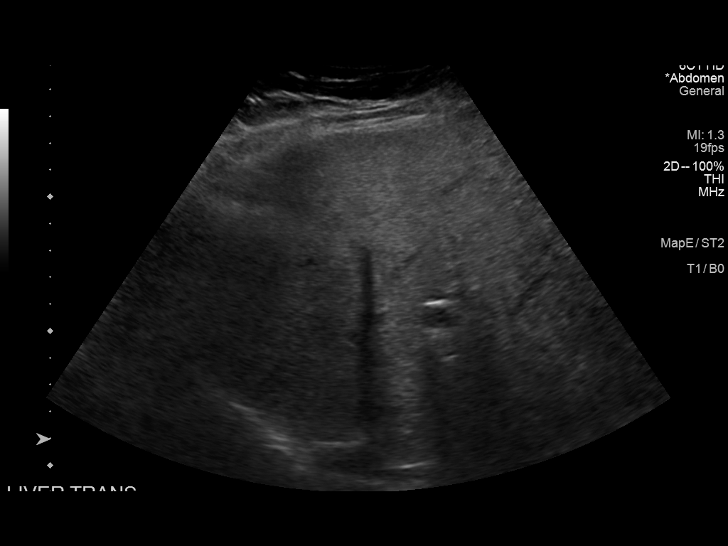
[im 28/34]
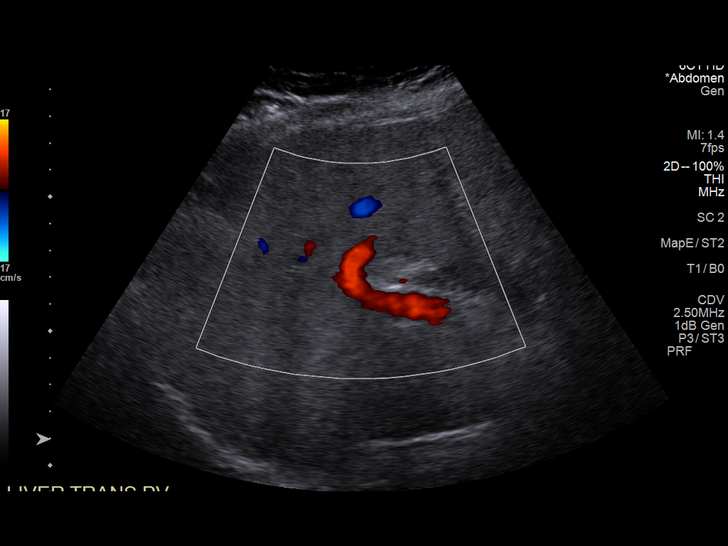
[im 31/34]
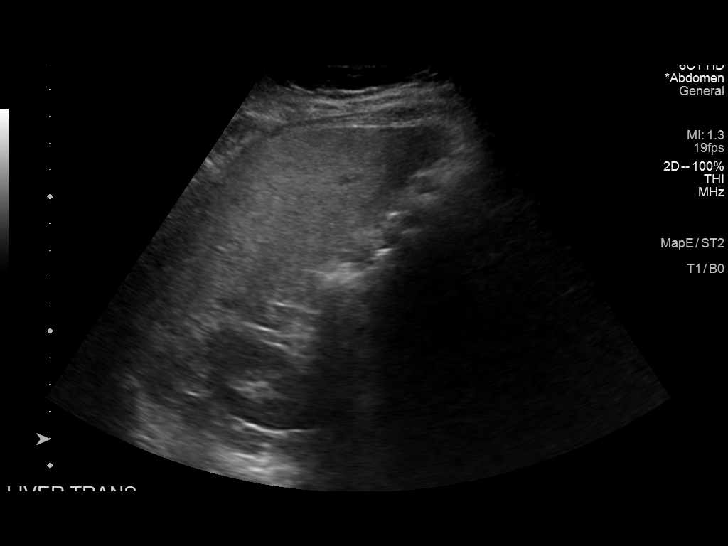
[im 34/34]
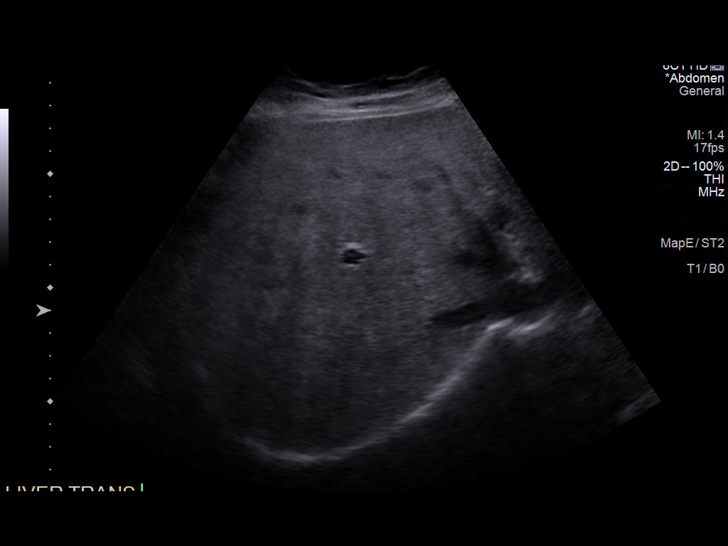

[14 of 25 positions shown; findings below may reference images not displayed]

FINDINGS: Gallbladder:

Surgically absent

Common bile duct:

Diameter: 5 mm, normal

Liver:

Echogenic and mildly inhomogeneous hepatic parenchyma, likely fatty
infiltration though this can be seen with cirrhosis and certain
infiltrative disorders. No focal hepatic mass or nodularity. Portal
vein is patent on color Doppler imaging with normal direction of
blood flow towards the liver.

Other: No RIGHT upper quadrant free fluid
IMPRESSION: Post cholecystectomy.

Probable fatty infiltration of liver as above.

No acute abnormalities.

## 2020-09-09 ENCOUNTER — Ambulatory Visit: Payer: Managed Care, Other (non HMO) | Attending: Internal Medicine

## 2020-09-09 DIAGNOSIS — Z23 Encounter for immunization: Secondary | ICD-10-CM

## 2020-09-09 NOTE — Progress Notes (Signed)
   Covid-19 Vaccination Clinic  Name:  Tracie Wagner    MRN: [id number] DOB: May 05, 1956  09/09/2020  Tracie Wagner was observed post Covid-19 immunization for 15 minutes without incident. She was provided with Vaccine Information Sheet and instruction to access the V-Safe system.   Tracie Wagner was instructed to call 911 with any severe reactions post vaccine: Marland Kitchen Difficulty breathing  . Swelling of face and throat  . A fast heartbeat  . A bad rash all over body  . Dizziness and weakness

## 2021-06-08 ENCOUNTER — Other Ambulatory Visit: Payer: Self-pay | Admitting: Family Medicine

## 2021-06-08 DIAGNOSIS — Z13820 Encounter for screening for osteoporosis: Secondary | ICD-10-CM

## 2021-07-02 ENCOUNTER — Ambulatory Visit
Admission: RE | Admit: 2021-07-02 | Discharge: 2021-07-02 | Disposition: A | Discharge status: Home / Self Care | Payer: Managed Care, Other (non HMO) | Source: Ambulatory Visit | Attending: Family Medicine | Admitting: Family Medicine

## 2021-07-02 ENCOUNTER — Other Ambulatory Visit: Payer: Self-pay

## 2021-07-02 DIAGNOSIS — Z13820 Encounter for screening for osteoporosis: Secondary | ICD-10-CM

## 2021-09-20 ENCOUNTER — Other Ambulatory Visit: Payer: Self-pay | Admitting: Family Medicine

## 2021-09-20 DIAGNOSIS — Z1231 Encounter for screening mammogram for malignant neoplasm of breast: Secondary | ICD-10-CM

## 2021-09-29 ENCOUNTER — Ambulatory Visit (HOSPITAL_COMMUNITY)
Admission: EM | Admit: 2021-09-29 | Discharge: 2021-09-29 | Disposition: A | Discharge status: Home / Self Care | Payer: Managed Care, Other (non HMO) | Attending: Physician Assistant | Admitting: Physician Assistant

## 2021-09-29 ENCOUNTER — Encounter (HOSPITAL_COMMUNITY): Payer: Self-pay

## 2021-09-29 ENCOUNTER — Other Ambulatory Visit: Payer: Self-pay

## 2021-09-29 DIAGNOSIS — R300 Dysuria: Secondary | ICD-10-CM | POA: Diagnosis present

## 2021-09-29 DIAGNOSIS — N3001 Acute cystitis with hematuria: Secondary | ICD-10-CM | POA: Diagnosis present

## 2021-09-29 DIAGNOSIS — R1030 Lower abdominal pain, unspecified: Secondary | ICD-10-CM | POA: Insufficient documentation

## 2021-09-29 LAB — POCT URINALYSIS DIPSTICK, ED / UC
Bilirubin Urine: NEGATIVE
Glucose, UA: NEGATIVE mg/dL
Ketones, ur: NEGATIVE mg/dL
Nitrite: POSITIVE — AB
Protein, ur: 100 mg/dL — AB
Specific Gravity, Urine: 1.015 (ref 1.005–1.030)
Urobilinogen, UA: 0.2 mg/dL (ref 0.0–1.0)
pH: 5.5 (ref 5.0–8.0)

## 2021-09-29 MED ORDER — LIDOCAINE HCL (PF) 1 % IJ SOLN
INTRAMUSCULAR | Status: AC
  Filled 2021-09-29: qty 4

## 2021-09-29 MED ORDER — CEFTRIAXONE SODIUM 1 G IJ SOLR
1.0000 g | Freq: Once | INTRAMUSCULAR | Status: AC
  Administered 2021-09-29: 1 g via INTRAMUSCULAR

## 2021-09-29 MED ORDER — CEPHALEXIN 500 MG PO CAPS: 500.0000 mg | ORAL_CAPSULE | Freq: Four times a day (QID) | ORAL | 0 refills | Status: AC

## 2021-09-29 MED ORDER — CEFTRIAXONE SODIUM 1 G IJ SOLR
INTRAMUSCULAR | Status: AC
  Filled 2021-09-29: qty 10

## 2021-09-29 NOTE — ED Provider Notes (Signed)
[facility]    CSN: [id number] Arrival date & time: 09/29/21  1653      History   Chief Complaint Chief Complaint  Patient presents with   Urinary Tract Infection    HPI Tracie Wagner is a 65 y.o. female.   Patient presents today with a 1 day history of UTI.  Reports urinary frequency, urinary urgency, lower abdominal pain, dysuria.  Denies any vaginal symptoms including discharge, pain, bleeding.  Denies any fever, nausea, vomiting.  She does have a history of recurrent urinary tract infections and was recently treated last week with Macrobid.  Reports symptoms resolving until recurrence today.  Denies any recent urogenital procedures.  She has not seen a urologist in the past.  She does have a history of diabetes but denies additional immunosuppression.  Denies any additional antibiotic use.   Past Medical History:  Diagnosis Date   Abnormal liver function    Diabetes mellitus without complication ([facility])    Family history of adverse reaction to anesthesia    son has ponv   Fatty liver 2015   Hypercholesteremia    Hypertension    Osteoarthritis    oa   Pneumonia as child   Restless leg syndrome     Patient Active Problem List   Diagnosis Date Noted   Restless legs syndrome 02/04/2017   S/P right TKA 09/02/2016   S/P knee replacement 09/02/2016   Ingrown left big toenail 07/15/2016   Pincer nail deformity 07/15/2016   PLMD (periodic limb movement disorder) 07/02/2010   Snoring 07/02/2010   PURE HYPERCHOLESTEROLEMIA 05/03/2010   HYPERTENSION, BENIGN ESSENTIAL 05/03/2010   OSTEOARTHRITIS, GENERALIZED, MULTIPLE JOINTS 05/03/2010   ELEVATED BLOOD PRESSURE 05/03/2010   LIVER FUNCTION TESTS, ABNORMAL, HX OF 05/03/2010    Past Surgical History:  Procedure Laterality Date   CHOLECYSTECTOMY  11/1998   ingrown toenail removed   1 month ago   left great toe    left [facility]umn cmp surgery  2015   right great toenail removed  several yrs ago   SHOULDER SURGERY  Right 8 yrs ago   rotator cuff and acromoplasty   surgery for atopic surgery  20 yrs ago   TOTAL KNEE ARTHROPLASTY Right 09/02/2016   Procedure: RIGHT TOTAL KNEE ARTHROPLASTY;  Surgeon: Paralee Cancel, MD;  Location: [facility];  Service: Orthopedics;  Laterality: Right;    OB History   No obstetric history on file.      Home Medications    Prior to Admission medications   Medication Sig Start Date End Date Taking? Authorizing Provider  cephALEXin (KEFLEX) 500 MG capsule Take 1 capsule (500 mg total) by mouth 4 (four) times daily. 09/29/21  Yes Gaylen Pereira, Derry Skill, PA-C  ALPRAZolam (XANAX) 0.5 MG tablet Take 0.5 mg by mouth 2 (two) times daily as needed for anxiety.  12/28/15   [provider]  amLODipine (NORVASC) 5 MG tablet Take 5 mg by mouth daily.      [provider]  Calcium 1500 MG tablet Take 1,500 mg by mouth daily.      [provider]  cholecalciferol (VITAMIN D) 1000 UNITS tablet Take 1,000 Units by mouth daily.      [provider]  ferrous sulfate 325 (65 FE) MG tablet Take 1 tablet (325 mg total) by mouth 3 (three) times daily with meals. 09/03/16   Danae Orleans, PA-C  fish oil-omega-3 fatty acids 1000 MG capsule Take 1 capsule by mouth 2 (two) times daily.  [provider]  folic acid (FOLVITE) 800 MCG tablet Take 800 mcg by mouth daily.      [provider]  HYDROcodone-acetaminophen (NORCO) 7.5-325 MG tablet Take 1-2 tablets by mouth every 4 (four) hours as needed for moderate pain. 09/02/16   Lanney Gins, PA-C  metFORMIN (GLUCOPHAGE) 500 MG tablet Take 1,000 mg by mouth 2 (two) times daily.     [provider]  Multiple Vitamin (MULTIVITAMIN) capsule Take 1 capsule by mouth daily.      [provider]  rOPINIRole (REQUIP) 0.5 MG tablet Take 2 tablets (1 mg total) by mouth at bedtime. 03/31/19   Glenford Bayley, NP  WELCHOL 625 MG tablet Take 2 tablets by mouth Twice daily. 10/20/12   [provider]    Family History Family History  Problem Relation Age of Onset   Heart disease Mother    Heart disease Sister    Heart disease Maternal Grandfather     Social History Social History   Tobacco Use   Smoking status: Never   Smokeless tobacco: Never  Substance Use Topics   Alcohol use: Yes    Comment: once a week   Drug use: No     Allergies   Ace inhibitors, Crestor [rosuvastatin calcium], Demerol [meperidine], Meperidine hcl, and Septra [sulfamethoxazole-trimethoprim]   Review of Systems Review of Systems  Constitutional:  Positive for activity change. Negative for appetite change, fatigue and fever.  Respiratory:  Negative for cough and shortness of breath.   Cardiovascular:  Negative for chest pain.  Gastrointestinal:  Negative for abdominal pain, diarrhea, nausea and vomiting.  Genitourinary:  Positive for dysuria, frequency and urgency. Negative for vaginal bleeding, vaginal discharge and vaginal pain.  Neurological:  Negative for dizziness, light-headedness and headaches.    Physical Exam Triage Vital Signs ED Triage Vitals  Enc Vitals Group     BP 09/29/21 1806 (!) 146/90     Pulse Rate 09/29/21 1806 82     Resp 09/29/21 1806 17     Temp --      Temp src --      SpO2 09/29/21 1806 97 %     Weight --      Height --      Head Circumference --      Peak Flow --      Pain Score 09/29/21 1804 8     Pain Loc --      Pain Edu? --      Excl. in GC? --    No data found.  Updated Vital Signs BP (!) 146/90 (BP Location: Right Arm)   Pulse 82   Resp 17   SpO2 97%   Visual Acuity Right Eye Distance:   Left Eye Distance:   Bilateral Distance:    Right Eye Near:   Left Eye Near:    Bilateral Near:     Physical Exam Vitals reviewed.  Constitutional:      General: She is awake. She is not in acute distress.    Appearance: Normal appearance. She is well-developed. She is not ill-appearing.     Comments: Very pleasant female appears  stated age in no acute distress sitting comfortably in exam room  HENT:     Head: Normocephalic and atraumatic.  Cardiovascular:     Rate and Rhythm: Normal rate and regular rhythm.     Heart sounds: Normal heart sounds, S1 normal and S2 normal. No murmur heard. Pulmonary:     Effort: Pulmonary  effort is normal.     Breath sounds: Normal breath sounds. No wheezing, rhonchi or rales.     Comments: Clear to auscultation bilaterally Abdominal:     General: Bowel sounds are normal.     Palpations: Abdomen is soft.     Tenderness: There is abdominal tenderness in the suprapubic area. There is no right CVA tenderness, left CVA tenderness, guarding or rebound.     Comments: Mild tenderness palpation of the lower abdomen.  No evidence of acute abdomen on physical exam.  Psychiatric:        Behavior: Behavior is cooperative.     UC Treatments / Results  Labs (all labs ordered are listed, but only abnormal results are displayed) Labs Reviewed  POCT URINALYSIS DIPSTICK, ED / UC - Abnormal; Notable for the following components:      Result Value   Hgb urine dipstick LARGE (*)    Protein, ur 100 (*)    Nitrite POSITIVE (*)    Leukocytes,Ua LARGE (*)    All other components within normal limits  URINE CULTURE    EKG   Radiology No results found.  Procedures Procedures (including critical care time)  Medications Ordered in UC Medications  cefTRIAXone (ROCEPHIN) injection 1 g (has no administration in time range)    Initial Impression / Assessment and Plan / UC Course  I have reviewed the triage vital signs and the nursing notes.  Pertinent labs & imaging results that were available during my care of the patient were reviewed by me and considered in my medical decision making (see chart for details).     UA consistent with urinary tract infection.  Vital signs and physical exam are reassuring today.  She was given Rocephin 1 g in clinic given recent antibiotic use and recurrent  symptoms.  We will start Keflex.  Urine culture was obtained-results pending.  Discussed that we will contact her if any change her antibiotics based on susceptibilities identified on culture.  She is to drink plenty of fluid and rest.  Discussed that she should likely see a urologist given recurrent symptoms and encouraged to follow-up with primary care provider to determine if referral is appropriate.  Discussed alarm symptoms that warrant emergent evaluation.  Strict return precautions given to which she expressed understanding.  Final Clinical Impressions(s) / UC Diagnoses   Final diagnoses:  Acute cystitis with hematuria  Dysuria  Lower abdominal pain     Discharge Instructions      You have a urinary tract infection.  We gave you a shot of antibiotics but I also like you to start Keflex (cephalexin) tomorrow.  Take this 4 times a day.  It can upset your stomach so take it with food.  Make sure you are drinking plenty of fluid.  We will contact you if your culture results indicate we need to change your antibiotics.  If you have any worsening symptoms you need to return immediately for evaluation including fever, persistent urinary symptoms, abdominal pain, nausea, vomiting.  I would recommend following up with urologist given recurrent episodes.     ED Prescriptions     Medication Sig Dispense Auth. Provider   cephALEXin (KEFLEX) 500 MG capsule Take 1 capsule (500 mg total) by mouth 4 (four) times daily. 20 capsule Fatumata Kashani, Derry Skill, PA-C      PDMP not reviewed this encounter.   Terrilee Croak, PA-C 09/29/21 1858

## 2021-09-29 NOTE — ED Triage Notes (Signed)
Pt presents with c/o UTI. States she has frequency and painful urination.

## 2021-09-29 NOTE — Discharge Instructions (Signed)
You have a urinary tract infection.  We gave you a shot of antibiotics but I also like you to start Keflex (cephalexin) tomorrow.  Take this 4 times a day.  It can upset your stomach so take it with food.  Make sure you are drinking plenty of fluid.  We will contact you if your culture results indicate we need to change your antibiotics.  If you have any worsening symptoms you need to return immediately for evaluation including fever, persistent urinary symptoms, abdominal pain, nausea, vomiting.  I would recommend following up with urologist given recurrent episodes.

## 2021-10-02 LAB — URINE CULTURE: Culture: >=100000 — AB

## 2021-10-25 ENCOUNTER — Ambulatory Visit
Admission: RE | Admit: 2021-10-25 | Discharge: 2021-10-25 | Disposition: A | Discharge status: Home / Self Care | Payer: Managed Care, Other (non HMO) | Source: Ambulatory Visit | Attending: Family Medicine | Admitting: Family Medicine

## 2021-10-25 DIAGNOSIS — Z1231 Encounter for screening mammogram for malignant neoplasm of breast: Secondary | ICD-10-CM

## 2021-11-29 ENCOUNTER — Other Ambulatory Visit: Payer: Self-pay | Admitting: Family Medicine

## 2021-11-29 DIAGNOSIS — M5417 Radiculopathy, lumbosacral region: Secondary | ICD-10-CM

## 2021-12-11 ENCOUNTER — Ambulatory Visit
Admission: RE | Admit: 2021-12-11 | Discharge: 2021-12-11 | Disposition: A | Discharge status: Home / Self Care | Payer: Managed Care, Other (non HMO) | Source: Ambulatory Visit | Attending: Family Medicine | Admitting: Family Medicine

## 2021-12-11 DIAGNOSIS — M5417 Radiculopathy, lumbosacral region: Secondary | ICD-10-CM

## 2022-10-08 ENCOUNTER — Other Ambulatory Visit: Payer: Self-pay | Admitting: Internal Medicine

## 2022-10-08 ENCOUNTER — Encounter: Payer: Self-pay | Admitting: Internal Medicine

## 2022-10-08 DIAGNOSIS — Z1231 Encounter for screening mammogram for malignant neoplasm of breast: Secondary | ICD-10-CM

## 2022-11-07 ENCOUNTER — Ambulatory Visit: Payer: Managed Care, Other (non HMO)

## 2022-12-11 ENCOUNTER — Ambulatory Visit
Admission: RE | Admit: 2022-12-11 | Discharge: 2022-12-11 | Disposition: A | Discharge status: Home / Self Care | Payer: Medicare Other | Source: Ambulatory Visit | Attending: Internal Medicine | Admitting: Internal Medicine

## 2022-12-11 DIAGNOSIS — Z1231 Encounter for screening mammogram for malignant neoplasm of breast: Secondary | ICD-10-CM

## 2023-06-04 DIAGNOSIS — N183 Chronic kidney disease, stage 3 unspecified: Secondary | ICD-10-CM | POA: Diagnosis not present

## 2023-06-04 DIAGNOSIS — Z0000 Encounter for general adult medical examination without abnormal findings: Secondary | ICD-10-CM | POA: Diagnosis not present

## 2023-06-04 DIAGNOSIS — E7800 Pure hypercholesterolemia, unspecified: Secondary | ICD-10-CM | POA: Diagnosis not present

## 2023-06-04 DIAGNOSIS — Z23 Encounter for immunization: Secondary | ICD-10-CM | POA: Diagnosis not present

## 2023-06-04 DIAGNOSIS — I129 Hypertensive chronic kidney disease with stage 1 through stage 4 chronic kidney disease, or unspecified chronic kidney disease: Secondary | ICD-10-CM | POA: Diagnosis not present

## 2023-06-04 DIAGNOSIS — Z13820 Encounter for screening for osteoporosis: Secondary | ICD-10-CM | POA: Diagnosis not present

## 2023-06-04 DIAGNOSIS — E559 Vitamin D deficiency, unspecified: Secondary | ICD-10-CM | POA: Diagnosis not present

## 2023-06-04 DIAGNOSIS — M8588 Other specified disorders of bone density and structure, other site: Secondary | ICD-10-CM | POA: Diagnosis not present

## 2023-06-04 DIAGNOSIS — E1121 Type 2 diabetes mellitus with diabetic nephropathy: Secondary | ICD-10-CM | POA: Diagnosis not present

## 2023-06-04 DIAGNOSIS — M1990 Unspecified osteoarthritis, unspecified site: Secondary | ICD-10-CM | POA: Diagnosis not present

## 2023-06-04 DIAGNOSIS — R946 Abnormal results of thyroid function studies: Secondary | ICD-10-CM | POA: Diagnosis not present

## 2023-06-04 DIAGNOSIS — G2581 Restless legs syndrome: Secondary | ICD-10-CM | POA: Diagnosis not present

## 2023-06-05 ENCOUNTER — Other Ambulatory Visit: Payer: Self-pay | Admitting: Internal Medicine

## 2023-06-05 DIAGNOSIS — M8588 Other specified disorders of bone density and structure, other site: Secondary | ICD-10-CM

## 2023-08-22 DIAGNOSIS — B355 Tinea imbricata: Secondary | ICD-10-CM | POA: Diagnosis not present

## 2023-08-22 DIAGNOSIS — L570 Actinic keratosis: Secondary | ICD-10-CM | POA: Diagnosis not present

## 2023-08-22 DIAGNOSIS — X32XXXA Exposure to sunlight, initial encounter: Secondary | ICD-10-CM | POA: Diagnosis not present

## 2023-09-19 DIAGNOSIS — N390 Urinary tract infection, site not specified: Secondary | ICD-10-CM | POA: Diagnosis not present

## 2023-10-02 DIAGNOSIS — R0989 Other specified symptoms and signs involving the circulatory and respiratory systems: Secondary | ICD-10-CM | POA: Diagnosis not present

## 2023-10-03 DIAGNOSIS — R0989 Other specified symptoms and signs involving the circulatory and respiratory systems: Secondary | ICD-10-CM | POA: Diagnosis not present

## 2023-10-04 DIAGNOSIS — N390 Urinary tract infection, site not specified: Secondary | ICD-10-CM | POA: Diagnosis not present

## 2023-11-20 ENCOUNTER — Other Ambulatory Visit: Payer: Self-pay | Admitting: Internal Medicine

## 2023-11-20 DIAGNOSIS — Z1231 Encounter for screening mammogram for malignant neoplasm of breast: Secondary | ICD-10-CM

## 2024-01-13 ENCOUNTER — Ambulatory Visit
Admission: RE | Admit: 2024-01-13 | Discharge: 2024-01-13 | Disposition: A | Discharge status: Home / Self Care | Payer: Medicare Other | Source: Ambulatory Visit

## 2024-01-13 ENCOUNTER — Ambulatory Visit
Admission: RE | Admit: 2024-01-13 | Discharge: 2024-01-13 | Disposition: A | Discharge status: Home / Self Care | Payer: Medicare Other | Source: Ambulatory Visit | Attending: Internal Medicine

## 2024-01-13 DIAGNOSIS — M8588 Other specified disorders of bone density and structure, other site: Secondary | ICD-10-CM | POA: Diagnosis not present

## 2024-01-13 DIAGNOSIS — Z1231 Encounter for screening mammogram for malignant neoplasm of breast: Secondary | ICD-10-CM

## 2024-05-10 DIAGNOSIS — H35372 Puckering of macula, left eye: Secondary | ICD-10-CM | POA: Diagnosis not present

## 2024-05-10 DIAGNOSIS — E119 Type 2 diabetes mellitus without complications: Secondary | ICD-10-CM | POA: Diagnosis not present

## 2024-05-10 DIAGNOSIS — H2513 Age-related nuclear cataract, bilateral: Secondary | ICD-10-CM | POA: Diagnosis not present

## 2024-05-10 DIAGNOSIS — H524 Presbyopia: Secondary | ICD-10-CM | POA: Diagnosis not present

## 2024-05-16 DIAGNOSIS — R300 Dysuria: Secondary | ICD-10-CM | POA: Diagnosis not present

## 2024-06-11 ENCOUNTER — Encounter: Payer: Self-pay | Admitting: Advanced Practice Midwife

## 2024-06-23 DIAGNOSIS — N3001 Acute cystitis with hematuria: Secondary | ICD-10-CM | POA: Diagnosis not present

## 2024-06-23 DIAGNOSIS — R3915 Urgency of urination: Secondary | ICD-10-CM | POA: Diagnosis not present

## 2024-07-04 DIAGNOSIS — N390 Urinary tract infection, site not specified: Secondary | ICD-10-CM | POA: Diagnosis not present

## 2024-07-21 DIAGNOSIS — I129 Hypertensive chronic kidney disease with stage 1 through stage 4 chronic kidney disease, or unspecified chronic kidney disease: Secondary | ICD-10-CM | POA: Diagnosis not present

## 2024-07-21 DIAGNOSIS — G720 Drug-induced myopathy: Secondary | ICD-10-CM | POA: Diagnosis not present

## 2024-07-21 DIAGNOSIS — E559 Vitamin D deficiency, unspecified: Secondary | ICD-10-CM | POA: Diagnosis not present

## 2024-07-21 DIAGNOSIS — G2581 Restless legs syndrome: Secondary | ICD-10-CM | POA: Diagnosis not present

## 2024-07-21 DIAGNOSIS — R000 Tachycardia, unspecified: Secondary | ICD-10-CM | POA: Diagnosis not present

## 2024-07-21 DIAGNOSIS — N183 Chronic kidney disease, stage 3 unspecified: Secondary | ICD-10-CM | POA: Diagnosis not present

## 2024-07-21 DIAGNOSIS — E1121 Type 2 diabetes mellitus with diabetic nephropathy: Secondary | ICD-10-CM | POA: Diagnosis not present

## 2024-07-21 DIAGNOSIS — Z0000 Encounter for general adult medical examination without abnormal findings: Secondary | ICD-10-CM | POA: Diagnosis not present

## 2024-07-21 DIAGNOSIS — E7800 Pure hypercholesterolemia, unspecified: Secondary | ICD-10-CM | POA: Diagnosis not present

## 2024-07-21 DIAGNOSIS — R946 Abnormal results of thyroid function studies: Secondary | ICD-10-CM | POA: Diagnosis not present

## 2024-07-21 DIAGNOSIS — N390 Urinary tract infection, site not specified: Secondary | ICD-10-CM | POA: Diagnosis not present

## 2024-07-21 DIAGNOSIS — M8588 Other specified disorders of bone density and structure, other site: Secondary | ICD-10-CM | POA: Diagnosis not present

## 2024-07-21 DIAGNOSIS — Z1159 Encounter for screening for other viral diseases: Secondary | ICD-10-CM | POA: Diagnosis not present

## 2024-07-22 DIAGNOSIS — N390 Urinary tract infection, site not specified: Secondary | ICD-10-CM | POA: Diagnosis not present

## 2024-07-22 DIAGNOSIS — E1121 Type 2 diabetes mellitus with diabetic nephropathy: Secondary | ICD-10-CM | POA: Diagnosis not present

## 2024-07-22 DIAGNOSIS — I129 Hypertensive chronic kidney disease with stage 1 through stage 4 chronic kidney disease, or unspecified chronic kidney disease: Secondary | ICD-10-CM | POA: Diagnosis not present

## 2024-07-22 DIAGNOSIS — R300 Dysuria: Secondary | ICD-10-CM | POA: Diagnosis not present

## 2024-07-29 DIAGNOSIS — N390 Urinary tract infection, site not specified: Secondary | ICD-10-CM | POA: Diagnosis not present

## 2024-09-01 DIAGNOSIS — R946 Abnormal results of thyroid function studies: Secondary | ICD-10-CM | POA: Diagnosis not present

## 2024-11-29 ENCOUNTER — Other Ambulatory Visit: Payer: Self-pay | Admitting: Internal Medicine

## 2024-11-29 DIAGNOSIS — Z1231 Encounter for screening mammogram for malignant neoplasm of breast: Secondary | ICD-10-CM

## 2024-12-25 ENCOUNTER — Encounter (HOSPITAL_BASED_OUTPATIENT_CLINIC_OR_DEPARTMENT_OTHER): Payer: Self-pay | Admitting: Emergency Medicine

## 2024-12-25 ENCOUNTER — Emergency Department (HOSPITAL_BASED_OUTPATIENT_CLINIC_OR_DEPARTMENT_OTHER)
Admission: EM | Admit: 2024-12-25 | Discharge: 2024-12-25 | Disposition: A | Discharge status: Home / Self Care | Attending: Emergency Medicine | Admitting: Emergency Medicine

## 2024-12-25 ENCOUNTER — Other Ambulatory Visit (HOSPITAL_BASED_OUTPATIENT_CLINIC_OR_DEPARTMENT_OTHER): Payer: Self-pay

## 2024-12-25 ENCOUNTER — Other Ambulatory Visit: Payer: Self-pay

## 2024-12-25 DIAGNOSIS — L03213 Periorbital cellulitis: Secondary | ICD-10-CM | POA: Diagnosis not present

## 2024-12-25 DIAGNOSIS — I10 Essential (primary) hypertension: Secondary | ICD-10-CM | POA: Diagnosis not present

## 2024-12-25 DIAGNOSIS — H0259 Other disorders affecting eyelid function: Secondary | ICD-10-CM

## 2024-12-25 DIAGNOSIS — Z79899 Other long term (current) drug therapy: Secondary | ICD-10-CM | POA: Diagnosis not present

## 2024-12-25 DIAGNOSIS — H5712 Ocular pain, left eye: Secondary | ICD-10-CM | POA: Diagnosis present

## 2024-12-25 MED ORDER — TETRACAINE HCL 0.5 % OP SOLN
1.0000 [drp] | Freq: Once | OPHTHALMIC | Status: AC
  Administered 2024-12-25: 1 [drp] via OPHTHALMIC
  Filled 2024-12-25: qty 4

## 2024-12-25 MED ORDER — POLYMYXIN B-TRIMETHOPRIM 10000-0.1 UNIT/ML-% OP SOLN
OPHTHALMIC | 0 refills | Status: AC
  Filled 2024-12-25: qty 10, 7d supply, fill #0

## 2024-12-25 MED ORDER — FLUORESCEIN SODIUM 1 MG OP STRP
1.0000 | ORAL_STRIP | Freq: Once | OPHTHALMIC | Status: AC
  Administered 2024-12-25: 1 via OPHTHALMIC
  Filled 2024-12-25: qty 1

## 2024-12-25 MED ORDER — CLINDAMYCIN HCL 150 MG PO CAPS
300.0000 mg | ORAL_CAPSULE | Freq: Three times a day (TID) | ORAL | 0 refills | Status: AC
  Filled 2024-12-25: qty 42, 7d supply, fill #0

## 2024-12-25 NOTE — ED Triage Notes (Signed)
 Pt caox4 ambulatory c/o L eye pain, redness, and swelling that started Wednesday, drainage this morning. Denies any recent trauma/injury.

## 2024-12-25 NOTE — ED Provider Notes (Signed)
 " [facility] AT [facility] Provider Note   CSN: [id number] Arrival date & time: 12/25/24  1115     Patient presents with: Eye Pain   Tracie Wagner is a 69 y.o. female patient with past medical history of HTN, HLD, OA presenting to ER with complaint of left eye pain. She has had about 3-4 days of left eyelid irritation, mild swelling, redness and some drainage of left eye.    No eye pain, restricted eye range of motion, pain with eye range of motion, proptosis, fever or trauma. No vision change.     Eye Pain       Prior to Admission medications  Medication Sig Start Date End Date Taking? Authorizing Provider  ALPRAZolam  (XANAX ) 0.5 MG tablet Take 0.5 mg by mouth 2 (two) times daily as needed for anxiety.  12/28/15   [provider]  amLODipine  (NORVASC ) 5 MG tablet Take 5 mg by mouth daily.      [provider]  Calcium 1500 MG tablet Take 1,500 mg by mouth daily.      [provider]  cephALEXin  (KEFLEX ) 500 MG capsule Take 1 capsule (500 mg total) by mouth 4 (four) times daily. 09/29/21   Raspet, Erin K, PA-C  cholecalciferol (VITAMIN D) 1000 UNITS tablet Take 1,000 Units by mouth daily.      [provider]  ferrous sulfate  325 (65 FE) MG tablet Take 1 tablet (325 mg total) by mouth 3 (three) times daily with meals. 09/03/16   Danella Cough, PA-C  fish oil-omega-3 fatty acids 1000 MG capsule Take 1 capsule by mouth 2 (two) times daily.      [provider]  folic acid (FOLVITE) 800 MCG tablet Take 800 mcg by mouth daily.      [provider]  HYDROcodone -acetaminophen  (NORCO) 7.5-325 MG tablet Take 1-2 tablets by mouth every 4 (four) hours as needed for moderate pain. 09/02/16   Danella Cough, PA-C  metFORMIN  (GLUCOPHAGE ) 500 MG tablet Take 1,000 mg by mouth 2 (two) times daily.     [provider]  Multiple Vitamin (MULTIVITAMIN) capsule Take 1 capsule by mouth daily.      [provider]  rOPINIRole  (REQUIP ) 0.5 MG tablet Take 2 tablets (1 mg total) by mouth at bedtime. 03/31/19   Hope Almarie ORN, NP  WELCHOL  625 MG tablet Take 2 tablets by mouth Twice daily. 10/20/12   [provider]    Allergies: Ace inhibitors, Crestor [rosuvastatin calcium], Demerol  [meperidine ], Meperidine  hcl, and Septra [sulfamethoxazole-trimethoprim ]    Review of Systems  Eyes:  Positive for pain.    Updated Vital Signs BP (!) 147/63 (BP Location: Right Arm)   Pulse 76   Temp 98.1 F (36.7 C)   Resp 17   SpO2 94%   Physical Exam Vitals and nursing note reviewed.  Constitutional:      General: She is not in acute distress.    Appearance: She is not toxic-appearing.  HENT:     Head: Normocephalic and atraumatic.  Eyes:     General: Lids are normal. Lids are everted, no foreign bodies appreciated. Vision grossly intact. Gaze aligned appropriately. No scleral icterus.    Intraocular pressure: Right eye pressure is 17 mmHg.     Extraocular Movements:     Right eye: Normal extraocular motion and no nystagmus.     Left eye: Normal extraocular motion and no nystagmus.     Conjunctiva/sclera: Conjunctivae normal.  Comments:  No increased uptake on fluorescein  dye exam.  2 pustules of left upper lateral eyelid with mild swelling, mild erythema.   Cardiovascular:     Rate and Rhythm: Normal rate and regular rhythm.     Pulses: Normal pulses.     Heart sounds: Normal heart sounds.  Pulmonary:     Effort: Pulmonary effort is normal. No respiratory distress.     Breath sounds: Normal breath sounds.  Abdominal:     General: Abdomen is flat. Bowel sounds are normal.     Palpations: Abdomen is soft.     Tenderness: There is no abdominal tenderness.  Skin:    General: Skin is warm and dry.     Findings: No lesion.  Neurological:     General: No focal deficit present.     Mental Status: She is alert and oriented to person, place, and time. Mental status is at  baseline.     (all labs ordered are listed, but only abnormal results are displayed) Labs Reviewed - No data to display  EKG: None  Radiology: No results found.   Procedures   Medications Ordered in the ED - No data to display                                  Medical Decision Making Risk Prescription drug management.   This patient presents to the ED for concern of eye pain, this involves an extensive number of treatment options, and is a complaint that carries with it a high risk of complications and morbidity.  The differential diagnosis includes glaucoma, orbital cellulitis, optic neuritis, conjunctivitis   Cardiac Monitoring: / EKG:  The patient was maintained on a cardiac monitor.     Problem List / ED Course / Critical interventions / Medication management  Patient presents to emergency room with complaint of left eye pain.  This has been ongoing for approximately 4 days.  On arrival hemodynamically stable and well-appearing.  She has some mild swelling and erythema over the left lateral eyelid as well as 2 areas that are concerning for stye.  She does not have fever, proptosis, globe displacement Smidt, pain with eye range of motion or limited eye range of motion.  No acuity is within normal limits.  Eye exam shows normal intraocular pressure.  No increased uptake on fluorescein  dye exam.  Exam is concerning for stye with maybe some early developing cellulitis.  Will treat with oral antibiotics. I have reviewed the patients home medicines and have made adjustments as needed. No sign of systemic illness or orbital cellulitis on exam.  Feel stable for discharge with outpatient follow-up.  Given ophthalmology follow-up.        Final diagnoses:  Superficial swelling of eyelid  Periorbital cellulitis of left eye    ED Discharge Orders          Ordered    erythromycin ophthalmic ointment        12/25/24 1200    clindamycin  (CLEOCIN ) 150 MG capsule  3 times  daily        12/25/24 1200               Cortni Tays, Warren SAILOR, PA-C 12/25/24 1204    Towana Ozell BROCKS, MD 12/25/24 1619  "

## 2024-12-25 NOTE — ED Notes (Signed)
 DC paperwork given and verbally understood.

## 2024-12-25 NOTE — Discharge Instructions (Signed)
 Your physical exam is consistent for early cellulitis of your eyelid.  I will start you on 300 mg clindamycin  every 8 hours, if symptoms have completely resolved after 5 days you can stop this antibiotic at day 5.  You can continue to use warm compress 3-4 times daily over the area of pain and swelling.  You can also use the erythromycin ointment.  Follow-up with an eye doctor and return to the ED with new or worsening symptoms.

## 2025-01-13 ENCOUNTER — Ambulatory Visit
